# Patient Record
Sex: Female | Born: 1935
Health system: Southern US, Community
[De-identification: ages and names within clinical notes are randomized; demographics above are authoritative.]

## PROBLEM LIST (undated history)

## (undated) DIAGNOSIS — D649 Anemia, unspecified: Secondary | ICD-10-CM

## (undated) HISTORY — PX: ABDOMINAL SURGERY: SHX537

## (undated) HISTORY — PX: OTHER SURGICAL HISTORY: SHX169

---

## 2000-02-23 ENCOUNTER — Encounter: Payer: Self-pay | Admitting: Pulmonary Disease

## 2000-02-23 ENCOUNTER — Inpatient Hospital Stay (HOSPITAL_COMMUNITY): Admission: AD | Admit: 2000-02-23 | Discharge: 2000-03-13 | Payer: Self-pay | Admitting: Pulmonary Disease

## 2000-02-24 ENCOUNTER — Encounter: Payer: Self-pay | Admitting: Pulmonary Disease

## 2000-02-25 ENCOUNTER — Encounter: Payer: Self-pay | Admitting: Pulmonary Disease

## 2000-02-26 ENCOUNTER — Encounter: Payer: Self-pay | Admitting: Thoracic Surgery (Cardiothoracic Vascular Surgery)

## 2000-02-27 ENCOUNTER — Encounter: Payer: Self-pay | Admitting: Thoracic Surgery (Cardiothoracic Vascular Surgery)

## 2000-02-28 ENCOUNTER — Encounter: Payer: Self-pay | Admitting: Pulmonary Disease

## 2000-02-29 ENCOUNTER — Encounter: Payer: Self-pay | Admitting: Thoracic Surgery

## 2000-03-01 ENCOUNTER — Encounter: Payer: Self-pay | Admitting: Thoracic Surgery

## 2000-03-01 ENCOUNTER — Encounter: Payer: Self-pay | Admitting: Cardiothoracic Surgery

## 2000-03-02 ENCOUNTER — Encounter: Payer: Self-pay | Admitting: Thoracic Surgery

## 2000-03-03 ENCOUNTER — Encounter: Payer: Self-pay | Admitting: Cardiothoracic Surgery

## 2000-03-04 ENCOUNTER — Encounter: Payer: Self-pay | Admitting: Thoracic Surgery

## 2000-03-05 ENCOUNTER — Encounter: Payer: Self-pay | Admitting: Thoracic Surgery

## 2000-03-06 ENCOUNTER — Encounter: Payer: Self-pay | Admitting: Thoracic Surgery

## 2000-03-07 ENCOUNTER — Encounter: Payer: Self-pay | Admitting: Thoracic Surgery

## 2000-03-08 ENCOUNTER — Encounter: Payer: Self-pay | Admitting: Thoracic Surgery

## 2000-03-11 ENCOUNTER — Encounter: Payer: Self-pay | Admitting: Thoracic Surgery

## 2000-03-17 ENCOUNTER — Emergency Department (HOSPITAL_COMMUNITY): Admission: EM | Admit: 2000-03-17 | Discharge: 2000-03-17 | Payer: Self-pay | Admitting: Emergency Medicine

## 2000-03-17 ENCOUNTER — Encounter: Payer: Self-pay | Admitting: Emergency Medicine

## 2000-03-22 ENCOUNTER — Encounter: Payer: Self-pay | Admitting: Thoracic Surgery

## 2000-03-22 ENCOUNTER — Inpatient Hospital Stay (HOSPITAL_COMMUNITY): Admission: EM | Admit: 2000-03-22 | Discharge: 2000-05-08 | Payer: Self-pay | Admitting: Emergency Medicine

## 2000-03-25 ENCOUNTER — Encounter: Payer: Self-pay | Admitting: General Surgery

## 2000-03-27 ENCOUNTER — Encounter: Payer: Self-pay | Admitting: General Surgery

## 2000-03-27 ENCOUNTER — Encounter: Payer: Self-pay | Admitting: Critical Care Medicine

## 2000-04-02 ENCOUNTER — Encounter: Payer: Self-pay | Admitting: General Surgery

## 2000-04-03 ENCOUNTER — Encounter: Payer: Self-pay | Admitting: General Surgery

## 2000-04-05 ENCOUNTER — Encounter: Payer: Self-pay | Admitting: General Surgery

## 2000-04-18 ENCOUNTER — Encounter: Payer: Self-pay | Admitting: General Surgery

## 2000-04-22 ENCOUNTER — Encounter: Payer: Self-pay | Admitting: General Surgery

## 2000-04-23 ENCOUNTER — Encounter: Payer: Self-pay | Admitting: General Surgery

## 2000-05-02 ENCOUNTER — Encounter: Payer: Self-pay | Admitting: General Surgery

## 2000-05-29 ENCOUNTER — Ambulatory Visit (HOSPITAL_COMMUNITY): Admission: RE | Admit: 2000-05-29 | Discharge: 2000-05-29 | Payer: Self-pay | Admitting: General Surgery

## 2004-06-06 ENCOUNTER — Ambulatory Visit (HOSPITAL_COMMUNITY): Admission: RE | Admit: 2004-06-06 | Discharge: 2004-06-06 | Payer: Self-pay | Admitting: Family Medicine

## 2004-08-14 ENCOUNTER — Ambulatory Visit: Payer: Self-pay | Admitting: *Deleted

## 2006-04-13 ENCOUNTER — Emergency Department (HOSPITAL_COMMUNITY): Admission: EM | Admit: 2006-04-13 | Discharge: 2006-04-13 | Payer: Self-pay | Admitting: Emergency Medicine

## 2006-06-10 ENCOUNTER — Ambulatory Visit: Payer: Self-pay | Admitting: Ophthalmology

## 2006-08-14 ENCOUNTER — Ambulatory Visit: Payer: Self-pay | Admitting: Ophthalmology

## 2010-03-02 ENCOUNTER — Inpatient Hospital Stay (HOSPITAL_COMMUNITY): Admission: EM | Admit: 2010-03-02 | Discharge: 2010-03-04 | Payer: Self-pay | Source: Home / Self Care

## 2010-03-03 ENCOUNTER — Encounter (INDEPENDENT_AMBULATORY_CARE_PROVIDER_SITE_OTHER): Payer: Self-pay | Admitting: Internal Medicine

## 2010-03-26 ENCOUNTER — Encounter: Payer: Self-pay | Admitting: Family Medicine

## 2010-05-03 NOTE — Discharge Summary (Signed)
NAME:  Ashley Goodwin, Ashley Goodwin             ACCOUNT NO.:  [id number]  MEDICAL RECORD NO.:  [id number]          PATIENT TYPE:  INP  LOCATION:  A329                          FACILITY:  [facility]  PHYSICIAN:  Elliot Meldrum L. Lendell Caprice, MDDATE OF BIRTH:  11/04/1921  DATE OF ADMISSION:  03/02/2010 DATE OF DISCHARGE:  12/31/2011LH                              DISCHARGE SUMMARY   DISCHARGE DIAGNOSES: 1. Near syncope. 2. Acute renal failure secondary to prerenal azotemia. 3. Dehydration. 4. Anemia. 5. Mild rhabdomyolysis. 6. Hypertension, new diagnosis.  DISCHARGE MEDICATIONS: 1. Amlodipine 5 mg a day. 2. Iron tablet, continue as previous. 3. Vitamin B6, continue as previous.  CONDITION:  Stable.  DIET:  Should be low-fat, low-salt with plenty of liquids.  ACTIVITY:  Increase slowly.  FOLLOWUP:  With primary care physician in 2-4 weeks.  CONSULTATIONS:  None.  PROCEDURES:  None.  LABORATORY DATA:  CBC significant for hemoglobin of 10.4, hematocrit of 32.2, MCV is 67.  Basic metabolic panel on admission significant for a BUN of 35, creatinine 2.2.  At discharge, BUN is 15, creatinine 1.09. Liver function tests significant for an albumin of 3.1, total CPK was 609, MB fraction 9.2.  Normal troponin.  At discharge, total CPK is 428, LDL is 161, HDL 31, triglycerides 163, total cholesterol 225, TSH 1.266, ferritin 432.  TIBC 213, iron 21, B12 and folate normal.  Urinalysis showed small leukocyte esterase, negative nitrates, but many epithelial cells.  Serum myoglobin greater than 500.  DIAGNOSTICS:  Chest x-ray showed scarring or atelectasis left lung base, nothing acute.  Carotid Doppler showed atherosclerotic plaque less than 50% of the carotid arteries without critical stenosis, elevated peak systolic velocity in the distal left internal carotid artery probably related to tortuosity, antegrade vertebral flow.  EKG showed normal sinus rhythm.  Echocardiogram showed mild-to-moderate LVH,  ejection fraction 60-65%, no regional wall motion abnormalities, mildly dilated left atrium.  HISTORY AND HOSPITAL COURSE:  Please see H and P for details.  Ashley Goodwin is an 77 year old white female who has not visited a physician for many years.  She had gradual weakness and dizziness over the past few weeks.  She was found to have a blood pressure of 153/83 in the emergency room, but otherwise normal exam.  She had an unremarkable physical examination, had evidence of renal failure.  On admission, she was felt to be dehydrated and had mild rhabdomyolysis.  Initially, she was treated for urinary tract infection, but the urinalysis has many epithelial cells and she clinically has no evidence of urinary tract infection, so antibiotics were stopped.  She was given IV fluids and her renal failure resolved.  Her blood pressure remained elevated and she most likely has been hypertensive for quite some time.  She was started on Norvasc and will be discharged home.  She will follow up with primary care physician of choice.  I have instructed her and her family the importance of close followup and they voiced understanding.  She has been found to have an elevated LDL.  At this point, I have instructed her to follow a low-cholesterol diet for now.  Given her mild rhabdomyolysis and  questionable follow-up plans, it would not be prudent to start her on a statin at this point.  She is feeling much better. She is ambulating.  She is stronger and is stable for discharge.     Bethel Sirois L. Lendell Caprice, MD     CLS/MEDQ  D:  05/02/2010  T:  05/03/2010  Job:  [id number]  Electronically Signed by Crista Curb MD on 05/03/2010 03:58:19 PM

## 2010-05-15 LAB — URINALYSIS, ROUTINE W REFLEX MICROSCOPIC
Glucose, UA: NEGATIVE mg/dL
Nitrite: NEGATIVE
Specific Gravity, Urine: >1.03 — ABNORMAL HIGH (ref 1.005–1.030)
pH: 5.5 (ref 5.0–8.0)

## 2010-05-15 LAB — DIFFERENTIAL
Basophils Absolute: 0 10*3/uL (ref 0.0–0.1)
Basophils Relative: 0 % (ref 0–1)
Basophils Relative: 0 % (ref 0–1)
Eosinophils Absolute: 0.2 10*3/uL (ref 0.0–0.7)
Eosinophils Absolute: 0.2 10*3/uL (ref 0.0–0.7)
Eosinophils Relative: 2 % (ref 0–5)
Eosinophils Relative: 3 % (ref 0–5)
Lymphocytes Relative: 29 % (ref 12–46)
Lymphocytes Relative: 30 % (ref 12–46)
Lymphs Abs: 2 10*3/uL (ref 0.7–4.0)
Lymphs Abs: 2.2 10*3/uL (ref 0.7–4.0)
Monocytes Absolute: 0.8 10*3/uL (ref 0.1–1.0)
Monocytes Relative: 11 % (ref 3–12)
Monocytes Relative: 12 % (ref 3–12)
Neutro Abs: 3.8 10*3/uL (ref 1.7–7.7)
Neutro Abs: 4.3 10*3/uL (ref 1.7–7.7)
Neutro Abs: 5.6 10*3/uL (ref 1.7–7.7)
Neutrophils Relative %: 56 % (ref 43–77)
Neutrophils Relative %: 57 % (ref 43–77)

## 2010-05-15 LAB — URINE MICROSCOPIC-ADD ON

## 2010-05-15 LAB — CBC
HCT: 29.4 % — ABNORMAL LOW (ref 36.0–46.0)
HCT: 30 % — ABNORMAL LOW (ref 36.0–46.0)
HCT: 32.2 % — ABNORMAL LOW (ref 36.0–46.0)
Hemoglobin: 10.4 g/dL — ABNORMAL LOW (ref 12.0–15.0)
Hemoglobin: 9.5 g/dL — ABNORMAL LOW (ref 12.0–15.0)
MCH: 21.9 pg — ABNORMAL LOW (ref 26.0–34.0)
MCH: 21.9 pg — ABNORMAL LOW (ref 26.0–34.0)
MCHC: 32.3 g/dL (ref 30.0–36.0)
MCHC: 32.7 g/dL (ref 30.0–36.0)
MCV: 67.7 fL — ABNORMAL LOW (ref 78.0–100.0)
Platelets: 230 10*3/uL (ref 150–400)
RBC: 4.34 MIL/uL (ref 3.87–5.11)
RBC: 4.74 MIL/uL (ref 3.87–5.11)
RDW: 14.7 % (ref 11.5–15.5)
RDW: 14.8 % (ref 11.5–15.5)
WBC: 6.8 10*3/uL (ref 4.0–10.5)
WBC: 9.8 10*3/uL (ref 4.0–10.5)

## 2010-05-15 LAB — COMPREHENSIVE METABOLIC PANEL
ALT: 14 U/L (ref 0–35)
ALT: 16 U/L (ref 0–35)
AST: 21 U/L (ref 0–37)
Albumin: 2.8 g/dL — ABNORMAL LOW (ref 3.5–5.2)
Alkaline Phosphatase: 73 U/L (ref 39–117)
BUN: 15 mg/dL (ref 6–23)
CO2: 23 mEq/L (ref 19–32)
Calcium: 8.1 mg/dL — ABNORMAL LOW (ref 8.4–10.5)
Calcium: 8.8 mg/dL (ref 8.4–10.5)
Chloride: 111 mEq/L (ref 96–112)
Creatinine, Ser: 1.09 mg/dL (ref 0.4–1.2)
GFR calc Af Amer: 36 mL/min — ABNORMAL LOW (ref >60)
GFR calc Af Amer: 58 mL/min — ABNORMAL LOW (ref >60)
GFR calc non Af Amer: 48 mL/min — ABNORMAL LOW (ref >60)
Glucose, Bld: 112 mg/dL — ABNORMAL HIGH (ref 70–99)
Glucose, Bld: 80 mg/dL (ref 70–99)
Potassium: 3.8 mEq/L (ref 3.5–5.1)
Sodium: 141 mEq/L (ref 135–145)
Sodium: 141 mEq/L (ref 135–145)
Total Bilirubin: 0.1 mg/dL — ABNORMAL LOW (ref 0.3–1.2)
Total Protein: 6.3 g/dL (ref 6.0–8.3)
Total Protein: 6.7 g/dL (ref 6.0–8.3)

## 2010-05-15 LAB — LIPID PANEL
HDL: 31 mg/dL — ABNORMAL LOW (ref >39)
Total CHOL/HDL Ratio: 7.3 RATIO
Triglycerides: 163 mg/dL — ABNORMAL HIGH (ref <150)
VLDL: 33 mg/dL (ref 0–40)

## 2010-05-15 LAB — POCT CARDIAC MARKERS
CKMB, poc: 10.5 ng/mL (ref 1.0–8.0)
Myoglobin, poc: >500 ng/mL (ref 12–200)
Troponin i, poc: <0.05 ng/mL (ref 0.00–0.09)

## 2010-05-15 LAB — FOLATE: Folate: 19.8 ng/mL

## 2010-05-15 LAB — IRON AND TIBC
Saturation Ratios: 10 % — ABNORMAL LOW (ref 20–55)
TIBC: 213 ug/dL — ABNORMAL LOW (ref 250–470)
UIBC: 192 ug/dL

## 2010-05-15 LAB — CARDIAC PANEL(CRET KIN+CKTOT+MB+TROPI)
Total CK: 606 U/L — ABNORMAL HIGH (ref 7–177)
Troponin I: 0.02 ng/mL (ref 0.00–0.06)

## 2010-05-15 LAB — BASIC METABOLIC PANEL
CO2: 23 mEq/L (ref 19–32)
Chloride: 104 mEq/L (ref 96–112)
GFR calc Af Amer: 25 mL/min — ABNORMAL LOW (ref >60)
Sodium: 138 mEq/L (ref 135–145)

## 2010-05-15 LAB — CK TOTAL AND CKMB (NOT AT ARMC)
CK, MB: 5.5 ng/mL — ABNORMAL HIGH (ref 0.3–4.0)
Relative Index: 1.3 (ref 0.0–2.5)
Total CK: 428 U/L — ABNORMAL HIGH (ref 7–177)

## 2010-05-15 LAB — VITAMIN B12: Vitamin B-12: 487 pg/mL (ref 211–911)

## 2010-05-15 LAB — FERRITIN: Ferritin: 432 ng/mL — ABNORMAL HIGH (ref 10–291)

## 2010-07-21 NOTE — Op Note (Signed)
[place]. [facility]  Patient:    Ashley Goodwin, Ashley Goodwin                      MRN: [id number] Proc. Date: 04/17/00 Adm. Date:  [id number] Attending:  Caleen Essex                           Operative Report  PREOPERATIVE DIAGNOSIS:  Gastric outlet obstruction.  POSTOPERATIVE DIAGNOSIS:  Gastric outlet obstruction.  PROCEDURE:  Takedown of gastrostomy and jejunostomy tube and antecolic gastrojejunostomy.  SURGEON:  Ollen Gross. Carolynne Edouard, M.D.  ASSISTANT:  Catalina Lunger, M.D.  ANESTHESIA:  General endotracheal.  PROCEDURE:  After informed consent was obtained, the patient was brought to the operating room and placed in a supine position on the operating room table.  After adequate induction of general anesthesia, the patients abdomen was prepped with Betadine and draped in usual sterile manner.  A midline incision was made through her previous midline scar with a 10-blade knife. This incision was carried down through the skin and subcutaneous tissue with the Bovie electrocautery until the stitches in the linea alba were identified.  This area was also opened carefully with the Bovie electrocautery.  Once through the fascia, the preperitoneal space was probed bluntly and gently until the abdominal cavity was found.  Once the peritoneal cavity was opened, a finger was inserted to palpate the anterior abdominal wall and about the mid portion of the incision there was some adherence of bowel to the abdominal wall.  This was taken down by a combination of blunt finger dissection, as well as sharp dissection with the Metzenbaum scissors until the entire abdominal incision could be opened freely.  Once this was accomplished, there were adhesions encountered to the anterior abdominal wall on both sides and again, these were taken down sharply with Metzenbaum scissors.  Once this was accomplished, the area where the gastrostomy and jejunostomies were tacked to the  anterior abdominal wall were located and these were taken down sharply with the Metzenbaum scissors.  Once this was accomplished, the tubes were cut on the outside and brought through the abdominal wall.  Both tubes were then removed from the stomach and jejunum respectively and the enterotomies at each site were closed with multiple interrupted 3-0 silk suture Lembert stitches. The bowel was examined and the repairs appeared to seal these holes very well. The small bowel was then run in its entirety and was found to be normal.  The omentum was densely adherent in the left upper quadrant and only a small portion of this could be freed from its adhesions.  The anterior wall of the stomach was readily identifiable and opened.  A portion of the small bowel distal to the previous jejunostomy site was chosen that was freely mobile and reached up to the anterior wall of the stomach very easily.  The bowel was then the antimesenteric wall of the bowel was then approximated to the anterior wall of the stomach with proximally and distally with a 3-0 silk stitch.  An enterotomy was made in the anterior wall of the stomach and the antimesenteric border of the bowel and a GIA-75 stapler was fired along this line.  The anterior of the anastomosis was inspected and was found to be widely patent and hemostatic.  The stomach and jejunal opening/defect at the edge of the anastomosis was then closed with multiple interrupted 3-0 silk stitches until the  enterotomy was well approximated and closed.  The NG tube was then palpated and anesthesia was able to position it just above the anastomosis and anchored this in place.  The abdomen was then irrigated with copious amounts of saline.  The abdomen was then closed with two running #1 PDS sutures.  The fascia of the anterior abdominal wall was then closed with two running #1 PDS sutures and the skin was closed with staples.  The patient tolerated the procedure  well.  At the end of the case, all needle, sponge and instrument counts were correct.  The patient was awakened and taken to the recovery room in stable condition. DD:  04/18/00 TD:  04/18/00 Job: [id number]

## 2010-07-21 NOTE — Op Note (Signed)
[place]. [facility]  Patient:    Ashley Goodwin, CAPERS                      MRN: [id number] Proc. Date: 02/23/00 Adm. Date:  [id number] Attending:  Gailen Shelter CC:         Danice Goltz, M.D.   Operative Report  PREOPERATIVE DIAGNOSIS:  Esophageal rupture.  POSTOPERATIVE DIAGNOSIS:  Esophageal rupture.  PROCEDURES:  Left thoracotomy, primary repair and drainage of esophageal perforation, with intracostal muscle flap coverage.  SURGEON:  Salvatore Decent. Dorris Fetch, M.D.  ASSISTANTS:  Norton Blizzard, M.D., and Sherrie George, P.A.  ANESTHESIA:  General.  FINDINGS:  Chronic adhesions, left chest.  Multiple pockets of murky pleural fluid.  Frank pus contained in the area around the esophagus and aorta.  A 3 cm linear tear in the distal esophagus just above the gastroesophageal junction.  CLINICAL NOTE:  Ms. Ashley Goodwin is a 77 year old African-American female who presented to [facility] on December 19 with acute abdominal pain. She had free air in her abdomen.  She underwent exploration and repair of a perforated duodenal ulcer with a Cheree Ditto patch.  Subsequently postoperatively she developed increasing tachycardia and hypotension and a left pleural effusion.  She was transferred to [facility] for further evaluation.  She was seen and evaluated by Dr. Corliss Skains, who very astutely suspected esophageal rupture and Boerhaave-type syndrome.  The patient was sent for a swallow, which showed a distal esophageal perforation.  The patient was informed of the need for emergent surgery, possible repair versus resection and diversion, and because of her recent gastrointestinal surgery, Dr. Carolynne Edouard of general surgery was consulted as well.  The patient understood the risks and benefits and agreed to proceed with surgery.  She was taken to the operating room as soon as feasible.  DESCRIPTION OF PROCEDURE:  Ms. Galeno was brought to the operating room  from the medical intensive care unit.  Central venous access was obtained. Intraoperative arterial blood pressure monitoring catheter was placed.  The patient was anesthetized and intubated with a double-lumen endotracheal tube. The patient was placed in the right lateral decubitus position, and the left chest was prepped and draped in the usual fashion.  Single-lung ventilation was carried out at the right lung.  A left thoracotomy was performed.  The chest was entered through the sixth interspace.  The latissimus muscle was divided, and the serratus muscle was spared in the thoracotomy.  The pleural space was opened, and it was immediately obvious that there were chronic adhesions in the pleural space.  These were taken down with sharp and blunt dissection and electrocautery.  There were multiple pockets of murky fluid, which were sent for cultures, but no frank pus in the pleural space.  The adhesions were extensive.  The lung itself appeared relatively normal for the patients age.  After freeing the lung from the parietal pleura circumferentially, the posterior mediastinum was exposed by dividing the inferior pulmonary ligament.  At this point, gross pus was present.  This was cultured and evacuated.  There was air present in the mediastinal fat, but the mediastinal fat itself was not necrotic.  The esophagus was identified and inspected.  There was an approximately 3 cm long linear tear in the esophagus just above the gastroesophageal junction on the left side.  An NG tube was passed under direct vision.  The tube was advanced beyond the point of the perforation, securely taped at the nose.  The perforation in the esophagus then was closed in two layers.  The first layer of the closure was interrupted 3-0 Vicryl sutures to close the mucosa.  The serosa and overlying pleural peel then were closed then over the mucosal repair with interrupted 3-0 PDS sutures.  The surrounding pleura was  inadequate to use as a flap to cover the repair; therefore, an intercostal muscle flap was developed.  The seventh rib was reflected in a subperiosteal fashion.  The intercostal muscle and neurovascular bundle were divided posteriorly, and the muscle flap was based off the anterior circulation.  There was good flow through the intercostal vessels.  These were clipped.  The intercostal muscle flap then was brought down directly onto the repair and sutured circumferentially around the area of the repair with interrupted 3-0 Vicryl sutures.  The chest was copiously irrigated with warm normal saline.  Hemostasis was achieved.  Two 36 French straight chest tubes were placed anteriorly and posteriorly in the pleural space, and a 36 French right angle tube was placed directly adjacent to the esophageal repair.  The lung was then reinflated.  The ribs were reapproximated with #2 Vicryl paracostal sutures.  The [facility] rib approximator was used to bring the ribs together while the sutures were tied.  The serratus muscle was reattached inferolaterally with running #1 Vicryl suture, the latissimus fascia was closed with running #1 Vicryl suture, subcutaneous tissue was closed with a running 2-0 Vicryl suture, and the skin was closed with staples.  Chest tubes were placed to suction.  The patient remained in the operating room.  She was placed back into the supine position.  The abdomen was prepped and draped in the usual fashion, and then gastrostomy and jejunostomy tubes were performed by Drs. Jerrye Noble of the general surgery service.  After that, the patient was taken from the operating room to the surgical intensive care unit intubated and in critical condition.  All sponge, needle, and instrument counts were correct at the end of the procedure. DD:  02/25/00 TD:  02/26/00 Job: [id number]

## 2010-07-21 NOTE — Discharge Summary (Signed)
[place]. [facility]  Patient:    Ashley Goodwin, Ashley Goodwin                      MRN: [id number] Adm. Date:  [id number] Disc. Date: 03/13/00 Attending:  Cameron Proud Dictator:   Loura Pardon, P.A. CC:         Chevis Pretty, M.D.  [address] [place], [place] [address]   Discharge Summary  FINAL DIAGNOSES:  1. On this admission:  Hypotension, large left pleural effusion, 3 cm linear     tear at the left posterolateral esophagus secondary to emetic rupture.  2. Exploratory laparotomy with repair of large perforated duodenal ulcer with     omental Cheree Ditto) patch on the day before her current admission.  This was     done at [facility].  3. Gastric outlet obstruction demonstrated on second Gastrografin swallow,     December 31 (postoperative day #10).  4. Gross pus in posterior mediastinum discovered during operative repair of     esophageal perforation, December 21.  Place on imipenem postoperatively     for a 10-day course, then Tequin for postoperative day #10 to     postoperative #17.  5. Acute blood loss anemia postoperatively with transfusion December 23 and     March 05, 2000.  6. Mild toxic metabolic encephalopathy postoperatively resolved by     postoperative day #6.  7. Deconditioned post surgery requiring long-term physical therapy.  8. Elevated serum blood glucose in the postoperative period.  SECONDARY DIAGNOSES:  1. History of peptic ulcer disease.  2. Finding of H. pylori on admission to [facility] for perforated     duodenal ulcer/peritonitis.  3. History of tobacco habituation with a history of 42-pack-year history of     smoking.  4. Chronic obstructive pulmonary disease.  5. Status post exploratory laparotomy with repair of perforated duodenal     ulcer, December 20, with Graham patch (omental patch).  6. Hiatal hernia.  PROCEDURES:  1. February 23, 2000 - Left thoracotomy primary repair and drainage of    esophageal perforation with intracostal muscle flap coverage, Dr. Charlett Lango.  2. February 23, 2000 - Exploratory laparotomy and placement of gastrostomy     tube; placement of jejunostomy tube, Dr. Chevis Pretty.  DISCHARGE DISPOSITION:  Ms. Kingsley Herandez is judged a suitable candidate for discharge on postoperative day #19.  She has been largely afebrile in the postoperative period.  She was maintained on a ventilator after surgery until postoperative day #4 when she was extubated successfully.  She was achieving 96-100% oxygen saturation on room air by postoperative day #5.  She underwent esophagogram on postoperative day #7 which showed no extravasation of contrast in the esophagus.  A second Gastrografin swallow was performed on postoperative day #10.  There was no esophageal leak.  However, the Gastrografin was unable to flow from the stomach into the duodenum with possible finding of gastric outlet obstruction.  She remains, therefore, NPO except for ice chips.  Her gastrostomy tube was still in place with drainage of clear fluid.  She is receiving tube feeds of Jevity Plus at a rate of 70 cc an hour.  She is tolerating these tube feedings well.  Her wounds are well healed and all staples have been removed.  Her pain is controlled with analgesia through the J tube.  A transient toxic metabolic encephalopathy has resolved as of postoperative day #  6.  She was maintained on nebulizer therapy of albuterol and Atrovent in the postoperative period, but these nebulizers have no longer been necessary.  Home Health has been arranged for restorative care and to monitor tube feedings and to continue with instructions to the family.  She has been cleared for discharge by both general surgery and thoracic surgery.  She will undergo an endoscopy consisting of esophagogastro (hopefully) duodenoscopy for her gastric outlet obstruction syndrome as an outpatient.  DISCHARGE  MEDICATIONS:  1. Roxicet (Percocet elixir) 5-10 cc per J tube every 4-6h. p.r.n. pain.  2. Zantac EFFERdose 100 mg mixed with 6-8 ounces of water and given through     the J tube twice daily.  The J tube should be flushed by syringe with     30 cc of sterile saline or water every 6 hours.  DISCHARGE ACTIVITY:  Ambulate as tolerated.  DIET:  Jevity Plus at 70 cc/h.  Orally, she shall take ice chips only, otherwise she is NPO.  The gastrostomy tube will be drained as needed.  INSTRUCTIONS:  She may bathe daily, keeping the areas around her G tube and J tube clean and dry.  She is either to sponge bathe or shower only.  She is to wear protective hosiery consisting of TED hose daily.  FOLLOWUP:  She will have follow-up visit with Dr. Dorris Fetch on Wednesday, January 23, at 9:45 a.m.  She will present to the [facility] one hour before the appointment with Dr. Dorris Fetch for a chest x-ray.  She is also to arrange an appointment with Dr. Chevis Pretty two weeks from discharge. His telephone number is [phone].  She is to call and arrange an appointment  BRIEF HISTORY:  Ashley Goodwin is a 77 year old female who presented to [facility] [place], with abdominal pain and subsequently underwent exploratory laparotomy for perforated duodenal ulcer/peritonitis. The day after surgery she developed hypotension with pneumomediastinum.  She was transferred to  [facility] to the care of the pulmonary medicine critical care team.  In addition, she was consulted by the [facility]  In addition, Ms. Gorczyca had a large left pleural effusion.  She underwent Gastrografin swallow which showed perforation of the left posterolateral esophagus just about the level of the gastroesophageal junction.  She was taken urgently to the operating room for repair and drainage of perforated esophagus, with placement of intracostal flap  by Dr. Charlett Lango.   HOSPITAL COURSE:  After undergoing surgery on December 21 consisting of left thoracotomy, primary repair and drainage of esophageal perforation by Dr. Charlett Lango, and an additional placement of G tube and jejunostomy tube by Dr. Chevis Pretty, the patient was transferred in stable and satisfactory condition to the intensive care unit.  She was maintained on IV imipenem in the intraoperative and postoperative period for a 10-day course since there was finding of frank pus in the posterior mediastinum.  On postoperative day #2 she was transfused with 1 unit of packed red blood cells for a hemoglobin of 8.  Her hemoglobin was 9.4 on postoperative day #3.  She was started on tube feeds on postoperative day #3 by way of the J tube.  The tube feed was an elemental feed Peptamen VHP.  On postoperative day #4 Ms. Obeirne was extubated.  Chest x-ray showed improving left lower lobe atelectasis.  Body fluid cultures which had been taken intraoperatively showed multiple organisms.  No Staphylococcus aureus. There was no growth in  three days.  Blood cultures taken on December 21 were also negative.  On postoperative day #5 she was achieving 96-100% oxygen saturation on room air and she did not require oxygen supplementation throughout her ensuing hospital course.  On postoperative day #6 she began physical therapy with the finding that she would require long-term therapy to regain preoperative conditioning.  On postoperative day #7 she underwent esophagogram which showed no extravasation of contrast from the esophagus.  On postoperative day #8 the wound showed evidence of healing well.  On postoperative day #10 she underwent a repeat Gastrografin swallow.  The study also showed no esophageal leak.  However, the Gastrografin was unable to flow from the stomach into the duodenum with the finding of possible gastric outlet obstruction syndrome.  A CT of the  abdomen also on postoperative day #10 was negative for finding of abdominal abscess.  She was started on Tequin on postoperative day #10 and continued on that until postoperative day #17.  She continued to do well with both G tube and J tube in place.  On postoperative #17, Peptamen tube feeds were changed to Jevity Plus which she tolerated well at a goal of 70 cc/h.  By postoperative day #18 both general surgery and thoracic surgery agreed that she would be a suitable candidate for discharge.  She remained NPO with ice chips only and would at some time in the future have esophagogastroduodenoscopy as an outpatient. DD:  03/12/00 TD:  03/12/00 Job: [id number]

## 2010-07-21 NOTE — Consult Note (Signed)
[place]. [facility]  Patient:    Ashley Goodwin, Ashley Goodwin                      MRN: [id number] Adm. Date:  [id number] Attending:  Caleen Essex CC:         Chevis Pretty, M.D.  Salvatore Decent Dorris Fetch, M.D.   Consultation Report  I was asked by Dr. Carolynne Edouard to see this 77 year old female who was recently discharged from [facility] on January 9 after having been treated for a perforated duodenal ulcer and esophageal tear with mediastinitis.  She underwent thoracotomy and repair of the esophageal perforation on December 21 and exploratory laparotomy, NG and J tube placement.  She had previously had duodenal ulcer repaired at [facility] several weeks earlier.  G tube drainage since discharge has been continuously anywhere from 1-2 L per 24 hours and she has been receiving Jevity Plus at 70 cc/hour per J tube.  She presents now with weakness and metabolic derangement.  PAST MEDICAL HISTORY:  Peptic ulcer disease, H. pylori positive, cigarette abuse ______ pack years, COPD.  MEDICATIONS:  Percocet, Zantac, Jevity.  SOCIAL HISTORY:  Currently lives with her daughters.  PHYSICAL EXAMINATION:  VITAL SIGNS:  Blood pressure 109/73, heart rate 109.  GENERAL:  She is a thin appearing, dry appearing African-American female.  HEENT:  Dry tongue.  Facial features are sunken.  NECK:  Veins are flat.  LUNGS:  Clear.  HEART:  Regular rate and rhythm.  ABDOMEN:  J tube, G tube, and postoperative wound is well healed with Steri-Strips still in place.  EXTREMITIES:  No edema of the lower extremities.  There is decreased skin turgor.  LABORATORIES:  Sodium 157, potassium 2.8, chloride 68, CO2 62, BUN 209, creatinine 3.4, total protein 9.3, calcium 10.1, albumin 3.6, SGOT 134, SGPT 265, alkaline phosphatase 247.  White blood count 24,900.  ASSESSMENT:  1. Acute renal failure with prerenal azotemia.  2. Hyponatremia.  3. Hypokalemia.  4. Hypochloremic  metabolic alkalosis.  5. Dehydration.  6. Numbers 1-5 are secondary to gastrointestinal losses.  7. Abnormal liver function studies.  8. Status post esophageal repair.  9. Status post recent duodenal ulcer repair. 10. Status post mediastinitis. 11. Leukocytosis.  PLAN: 1. Replace free water, potassium, and chloride. 2. Family education regarding G tube. 3. Treat for possible infection. DD:  03/22/00 TD:  03/23/00 Job: [id number]

## 2010-07-21 NOTE — H&P (Signed)
[place]. [facility]  Patient:    Ashley Goodwin, BOSSMAN                      MRN: [id number] Adm. Date:  [id number] Attending:  Doug Sou                         History and Physical  HISTORY OF PRESENT ILLNESS:  Ms. Cardin is a 77 year old white female who is well known to the surgery service, who was initially transferred to this hospital on February 23, 2000, with the diagnosis of a perforated esophagus. Two days prior to this she had been operated on at [facility] for a perforated duodenal ulcer.  She at the time of the transfer was hypotensive and tachycardic.  She was volume resuscitated and taken urgently to the operating room after arrival for a left thoracotomy, through which her esophagus was repaired.  At that point, her abdomen was then re-explored, and her Cheree Ditto patch repair of her duodenal ulcer appeared to be intact. During this part of the procedure a G-tube and feeding jejunostomy tube were placed. Her postoperative course was significant for a prolonged intensive care unit stay.  She was able to come off of the ventilator after several days.  She then underwent a swallowing study, which showed no leak from her esophagus. Her gastrostomy tube was clamped.  Within 24 hours she had several episodes of vomiting, and a repeat study showed that she had a gastric outlet obstruction. At the time it was not clear whether this was secondary to edema, scarring from chronic peptic ulcer disease, or a result of the vagotomy.  Given that she had recently had her esophagus repaired, it was thought somewhat risky to expose her to an upper endoscopy at that point, so her gastrostomy was kept drained, and she was continued to be fed through her feeding jejunostomy tube. She tolerated this very well, and her condition improved to the point of being able to be discharged home.  Her discharge took place on March 13, 2000.  She was apparently doing  well at home until the last few days, at which time her feeding jejunostomy tube was clogged.  She was brought to the hospital, and by report her old feeding jejunostomy tube was removed, and a new one placed through the tract that was created.  This apparently happened several days ago, and over the last couple of days the patient has been complaining of abdominal pain and back pain, and the feeling of needing to go to the bathroom.  She has been a little more confused than prior days, and feels overall very weak.  She is now brought back to the hospital for further evaluation of these new pains, weakness, and confusion.  PAST MEDICAL HISTORY:  Significant for chronic peptic ulcer disease.  PAST SURGICAL HISTORY: 1. Significant for a repair of her duodenal ulcer 2. Repair of a perforated esophagus. 3. Placement of a feeding jejunostomy tube and gastrostomy tube.  SOCIAL HISTORY:  She smokes about one pack of cigarettes a day.  Denies any alcohol use.  FAMILY HISTORY:  Unremarkable.  CURRENT MEDICATIONS:  None except for her tube feeds.  ALLERGIES:  No known drug allergies.  PHYSICAL EXAMINATION:  GENERAL:  She is an elderly black female, lying in bed, who appears to be fairly weak and ill-appearing.  SKIN:  Cool and dry.  HEENT:  Extraocular muscles intact.  Pupils equal,  round, reactive to light.  NECK:  No bruits.  LUNGS:  Clear to auscultation bilaterally.  HEART:  A regular rate and rhythm.  ABDOMEN:  Soft, with no peritoneal signs, but some tenderness over her lower abdomen to palpation.  Her G-tube and J-tube sites are clean and intact.  EXTREMITIES:  No cyanosis, clubbing, or edema.  NEUROLOGIC:  She is oriented to person, place, and year, but not to day.  HEMATOLOGIC:  I can palpate no lymphadenopathy.  Plain x-rays of her abdomen were unremarkable.  There was no evidence for free air.  A CBC has been obtained, which is significant for a white count of  24,900, hemoglobin 14.2, hematocrit 44.6, and a platelet count of 439,000.  ASSESSMENT/PLAN:  This is a 77 year old black female, who underwent an esophageal and duodenal perforation repairs about one month ago, who presents now with new onset of abdominal pain, and weakness, and confusion.  PLAN:  We will obtain a CT scan of her abdomen and pelvis, to rule out an abscess or perforation.  We will admit her to the surgical service for further evaluation and management. DD:  03/22/00 TD:  03/22/00 Job: [id number]

## 2010-07-21 NOTE — Discharge Summary (Signed)
[place]. [facility]  Patient:    Ashley Goodwin, Ashley Goodwin                      MRN: [id number] Adm. Date:  [id number] Disc. Date: [id number] Attending:  Caleen Essex                           Discharge Summary  HISTORY:  Briefly, Ms. Blixt is a 77 year old woman who suffered a perforation of both her duodenum and her esophagus.  She underwent surgery several months ago for this and postoperatively was found to have gastric outlet obstruction.  She was maintained on tube feeds for approximately eight weeks, at which time she was then brought back to the hospital and taken to the operating room for a gastric jejunostomy.  The vagotomy portion of her ulcer surgery was done at the time of her esophageal repair.  Her postoperative course has been complicated by delayed gastric emptying. Several days ago, she was able to have her nasogastric tube removed.  During the few days following that, she vomited approximately once a day with decreasing volumes at each event and over the last 4-5 days, she has had no vomiting at all.  She has been able to tolerate a postgastrectomy diet, and today she is ready for discharge home.  DISCHARGE MEDICATIONS: 1. Pepcid 20 mg p.o. q.d. 2. Reglan 10 mg p.o. q.i.d. 3. Darvocet p.r.n.  ACTIVITY LEVEL:  She is to do no heavy lifting.  DIET:  Postgastrectomy diet.  CONDITION:  Stable.  FINAL DIAGNOSIS:  Gastric outlet obstruction.  FOLLOW-UP:  She is to follow up with me in the clinic in two weeks, and she has our office number if she has any problems between now and then.  Otherwise she is ready for discharge to home. DD:  05/08/00 TD:  05/08/00 Job: [id number]

## 2010-07-21 NOTE — Op Note (Signed)
[place]. [facility]  Patient:    Ashley Goodwin, Ashley Goodwin                      MRN: [id number] Proc. Date: 02/23/00 Adm. Date:  [id number] Attending:  Gailen Shelter                           Operative Report  PREOPERATIVE DIAGNOSES: 1. Perforated esophagus. 2. Recently perforated duodenal ulcer.  POSTOPERATIVE DIAGNOSES: 1. Perforated esophagus. 2. Recently perforated duodenal ulcer.  PROCEDURES:  Exploratory laparotomy and placement of gastrostomy tube, placement of feeding jejunostomy tube.  SURGEON:  Chevis Pretty, M.D.  ASSISTANT:  Abigail Miyamoto, M.D.  ANESTHESIA:  General endotracheal.  DESCRIPTION OF PROCEDURE:  After informed consent was obtained, the patient was brought to the operating room and placed in the supine position on the operating table.  After adequate induction of general anesthesia, the patients abdomen was prepped with Betadine and draped in the usual sterile manner.  Her previously-placed staples were removed with hemostats, and the sutures closing her midline abdominal fascia were cut with scissors and removed.  Her abdomen was opened, and there did not appear to be any gross spillage of duodenal contents within the abdomen.  There was a small amount of serosanguineous ascites fluid.  On examination of her duodenum, she had an obvious Graham patch repair that appeared to be intact with drains surrounding it.  An appropriate place on the anterior surface of the stomach was chosen, and two concentric 2-0 silk pursestring sutures were placed in a circular fashion.  A hole was made in the stomach at that point with the Bovie electrocautery, and a 28 French Malecot tube was placed through this hole in the stomach into its lumen.  Each of the pursestring sutures was then cinched down and tied.  The stomach was then tacked to the anterior abdominal wall at the exit site.  Prior to doing this, a small incision was made in the left upper  quadrant of the abdomen with a 10 blade knife, and a tonsil clamp was placed through this hole through the abdominal wall percutaneously.  The end of the Malecot tube was grasped and pulled through the abdominal wall to the outside.  Then the stomach was tacked to the anterior abdominal wall using multiple 2-0 silk suture stitches.  Next, the Malecot tube was anchored to the skin using a 2-0 nylon suture.  Next, a site was chosen approximately 20 cm downstream from the ligament of Treitz on the small bowel.  Again a pursestring 2-0 silk suture was placed in the antimesenteric wall of the small bowel, and a small hole was made in the middle of the pursestring.  A 14 French red rubber catheter was placed through this hold downstream into the bowel, and the pursestring suture was cinched and tied.  Another hole was made in the skin in the left lower quadrant, left middle area of the abdomen with a 10 blade knife, and a tonsil clamp was placed through this hole percutaneously into the abdominal cavity, and the end of the red rubber catheter was grasped and brought out through this hole.  The red rubber catheter was then tunneled along the small bowel in a Witzel fashion, and the small bowel was then anchored at this point to the anterior abdominal wall using multiple 2-0 silk suture stitches.  The abdomen was then irrigated with saline, and the abdominal  fascia was closed with two running #1 PDSs.  The subcutaneous tissue was irrigated with saline, and the skin was closed with staples.  The patient tolerated the procedure well.  At the end of the case, all needle, sponge, and instrument counts were correct.  The patient was taken intubated back to the ICU in critical condition. DD:  02/24/00 TD:  02/26/00 Job: [id number]

## 2010-07-21 NOTE — Consult Note (Signed)
[place]. [facility]  Patient:    Ashley Goodwin, Ashley Goodwin                      MRN: [id number] Proc. Date: 02/23/00 Adm. Date:  [id number] Attending:  Gailen Shelter                          Consultation Report  HISTORY OF PRESENT ILLNESS:  The patient is a 77 year old white female who presented to a hospital in [place] a couple of days ago with abdominal pain.  She underwent a CT scan at that point that, by report, was significant for free intra-abdominal air near the duodenum, thought to be a perforated duodenal ulcer.  She was taken to the operating room there and explored, and a perforation of her duodenum was repaired with a Cheree Ditto patch.  She was subsequently found to have a perforated esophagus and was transferred to [facility] for further management and evaluation.  She is in the process of being prepared to go to the operating room by CVTS to repair her perforated esophagus, and we are being asked to see her for placement of a gastrostomy tube and a feeding jejunostomy tube and to evaluate her prior to the operation at the same time.  PAST MEDICAL HISTORY:  Unremarkable except for the above.  PAST SURGICAL HISTORY:  Unremarkable except for the above.  SOCIAL HISTORY:  She denies any alcohol use.  She smokes a pack of cigarettes a day.  FAMILY HISTORY:  Unremarkable.  MEDICATIONS PRIOR TO THIS:  None.  ALLERGIES:  No known drug allergies.  PHYSICAL EXAMINATION:  ABDOMEN:  Soft and nontender with staples down the midline and two drains coming out her right side.  VITAL SIGNS:  She had a blood pressure of 130 and a heart rate of about 130.  GENERAL:  She was awake, alert, and able to communicate.  LUNGS:  She had bilateral rhonchi, the worse breath sounds on the left.  ASSESSMENT AND PLAN:  This is a 77 year old black female with a perforated esophagus and a recent history of a perforated duodenal ulcer, who is on her way to the  operating room for repair of her esophagus.  We will plan to explore her abdomen after CVTS is done. DD:  02/24/00 TD:  02/24/00 Job: [id number]

## 2010-10-31 ENCOUNTER — Inpatient Hospital Stay: Payer: Self-pay | Admitting: Internal Medicine

## 2010-12-26 ENCOUNTER — Emergency Department (HOSPITAL_COMMUNITY): Payer: Medicare Other

## 2010-12-26 ENCOUNTER — Emergency Department (HOSPITAL_COMMUNITY)
Admission: EM | Admit: 2010-12-26 | Discharge: 2010-12-26 | Disposition: A | Discharge status: Home / Self Care | Payer: Medicare Other | Attending: Emergency Medicine | Admitting: Emergency Medicine

## 2010-12-26 DIAGNOSIS — R109 Unspecified abdominal pain: Secondary | ICD-10-CM | POA: Insufficient documentation

## 2010-12-26 LAB — DIFFERENTIAL
Basophils Absolute: 0 10*3/uL (ref 0.0–0.1)
Basophils Relative: 0 % (ref 0–1)
Eosinophils Absolute: 0.1 10*3/uL (ref 0.0–0.7)
Eosinophils Relative: 1 % (ref 0–5)
Lymphocytes Relative: 30 % (ref 12–46)
Monocytes Absolute: 0.7 10*3/uL (ref 0.1–1.0)

## 2010-12-26 LAB — CBC
HCT: 31.6 % — ABNORMAL LOW (ref 36.0–46.0)
MCHC: 32.3 g/dL (ref 30.0–36.0)
MCV: 74 fL — ABNORMAL LOW (ref 78.0–100.0)
Platelets: 274 10*3/uL (ref 150–400)
RDW: 15.3 % (ref 11.5–15.5)
WBC: 7.2 10*3/uL (ref 4.0–10.5)

## 2010-12-26 LAB — URINALYSIS, ROUTINE W REFLEX MICROSCOPIC
Ketones, ur: NEGATIVE mg/dL
Protein, ur: NEGATIVE mg/dL
Urobilinogen, UA: 0.2 mg/dL (ref 0.0–1.0)

## 2010-12-26 LAB — BASIC METABOLIC PANEL
Calcium: 9.7 mg/dL (ref 8.4–10.5)
Creatinine, Ser: 1.52 mg/dL — ABNORMAL HIGH (ref 0.50–1.10)
GFR calc Af Amer: 34 mL/min — ABNORMAL LOW (ref >90)
GFR calc non Af Amer: 30 mL/min — ABNORMAL LOW (ref >90)
Sodium: 137 mEq/L (ref 135–145)

## 2010-12-26 LAB — URINE MICROSCOPIC-ADD ON

## 2010-12-26 MED ORDER — HYDROCODONE-ACETAMINOPHEN 5-325 MG PO TABS: 1.0000 | ORAL_TABLET | Freq: Four times a day (QID) | ORAL | Status: AC | PRN

## 2010-12-26 NOTE — ED Provider Notes (Signed)
History     CSN: [id number] Arrival date & time: 12/26/2010 11:00 AM   First MD Initiated Contact with Patient 12/26/10 1107      Chief Complaint  Patient presents with  . Flank Pain    (Consider location/radiation/quality/duration/timing/severity/associated sxs/prior treatment) HPI Comments: Pt has been having L posterior flank pain ~ 1 month.  States the sxs are just like when she had a UTI ~ 2-3 months ago.  No h/o kidney stones.  No fever or chills.  No trauma.  Pain worse with movement and palpation.  Patient is a 77 y.o. female presenting with flank pain. The history is provided by the patient. No language interpreter was used.  Flank Pain This is a recurrent problem. The current episode started more than 1 month ago. The problem occurs intermittently. The problem has been gradually worsening. Pertinent negatives include no chills, fever, myalgias, nausea or urinary symptoms. The symptoms are aggravated by bending, walking, twisting and standing.    History reviewed. No pertinent past medical history.  Past Surgical History  Procedure Date  . Abdominal surgery     No family history on file.  History  Substance Use Topics  . Smoking status: Never Smoker   . Smokeless tobacco: Not on file  . Alcohol Use: No    OB History    Grav Para Term Preterm Abortions TAB SAB Ect Mult Living                  Review of Systems  Constitutional: Negative for fever and chills.  Gastrointestinal: Negative for nausea.  Genitourinary: Positive for flank pain. Negative for dysuria, urgency, frequency, hematuria, decreased urine volume and pelvic pain.  Musculoskeletal: Negative for myalgias.  All other systems reviewed and are negative.    Allergies  Review of patient's allergies indicates no known allergies.  Home Medications   Current Outpatient Rx  Name Route Sig Dispense Refill  . ACETAMINOPHEN 500 MG PO TABS Oral Take 500 mg by mouth every 6 (six) hours as needed. For  pain     . VITAMIN B1-B12 IM Intramuscular Inject 1 mL into the muscle every 30 (thirty) days.        BP 139/81  Pulse 77  Temp(Src) 97.7 F (36.5 C) (Oral)  Resp 17  Ht 5\' 7"  (1.702 m)  Wt 145 lb (65.772 kg)  BMI 22.71 kg/m2  SpO2 100%  Physical Exam  Nursing note and vitals reviewed. Constitutional: She is oriented to person, place, and time. She appears well-developed and well-nourished. No distress.  HENT:  Head: Normocephalic and atraumatic.  Eyes: EOM are normal.  Neck: Normal range of motion.  Cardiovascular: Normal rate, regular rhythm and normal heart sounds.   Pulmonary/Chest: Effort normal and breath sounds normal.  Abdominal: Soft. She exhibits no distension. There is no tenderness.  Musculoskeletal: Normal range of motion.       Lumbar back: She exhibits tenderness and pain. She exhibits no bony tenderness, no swelling and no spasm.       Back:  Neurological: She is alert and oriented to person, place, and time.  Skin: Skin is warm and dry. She is not diaphoretic.  Psychiatric: She has a normal mood and affect. Judgment normal.    ED Course  Procedures (including critical care time)   Labs Reviewed  URINALYSIS, ROUTINE W REFLEX MICROSCOPIC   No results found.   No diagnosis found.    MDM  Discussed labs and CT with pt and family members.  Pt will f/u with her PCP, dr. Billie Lade Davenport.        Worthy Rancher, PA 12/26/10 [phone]

## 2010-12-26 NOTE — ED Provider Notes (Signed)
Medical screening examination/treatment/procedure(s) were performed by non-physician practitioner and as supervising physician I was immediately available for consultation/collaboration.   Rainah Kirshner W. Lanyla Costello, MD 12/26/10 1700 

## 2010-12-26 NOTE — ED Notes (Signed)
Pt c/o left side pain x 2-3 weeks.  Denies any urinary symptoms but says has history of kidney infection.  Denies history of kidney stones.  Denies any n/v.  States pain is worse with movement.

## 2010-12-26 NOTE — ED Notes (Signed)
Al Decant PA made aware of CT results back

## 2011-04-07 ENCOUNTER — Encounter (HOSPITAL_COMMUNITY): Payer: Self-pay | Admitting: Emergency Medicine

## 2011-04-07 ENCOUNTER — Emergency Department (HOSPITAL_COMMUNITY)
Admission: EM | Admit: 2011-04-07 | Discharge: 2011-04-07 | Disposition: A | Discharge status: Home / Self Care | Payer: Medicare Other | Attending: Emergency Medicine | Admitting: Emergency Medicine

## 2011-04-07 ENCOUNTER — Emergency Department (HOSPITAL_COMMUNITY): Payer: Medicare Other

## 2011-04-07 DIAGNOSIS — W1789XA Other fall from one level to another, initial encounter: Secondary | ICD-10-CM | POA: Insufficient documentation

## 2011-04-07 DIAGNOSIS — M79652 Pain in left thigh: Secondary | ICD-10-CM

## 2011-04-07 DIAGNOSIS — Z862 Personal history of diseases of the blood and blood-forming organs and certain disorders involving the immune mechanism: Secondary | ICD-10-CM | POA: Insufficient documentation

## 2011-04-07 DIAGNOSIS — Z7982 Long term (current) use of aspirin: Secondary | ICD-10-CM | POA: Insufficient documentation

## 2011-04-07 DIAGNOSIS — M25559 Pain in unspecified hip: Secondary | ICD-10-CM | POA: Insufficient documentation

## 2011-04-07 DIAGNOSIS — W19XXXA Unspecified fall, initial encounter: Secondary | ICD-10-CM

## 2011-04-07 HISTORY — DX: Anemia, unspecified: D64.9

## 2011-04-07 LAB — BASIC METABOLIC PANEL
BUN: 17 mg/dL (ref 6–23)
Chloride: 104 mEq/L (ref 96–112)
GFR calc Af Amer: 40 mL/min — ABNORMAL LOW (ref >90)
Glucose, Bld: 125 mg/dL — ABNORMAL HIGH (ref 70–99)
Potassium: 4.3 mEq/L (ref 3.5–5.1)
Sodium: 139 mEq/L (ref 135–145)

## 2011-04-07 LAB — CBC
HCT: 31.5 % — ABNORMAL LOW (ref 36.0–46.0)
Hemoglobin: 10.2 g/dL — ABNORMAL LOW (ref 12.0–15.0)
RDW: 15.4 % (ref 11.5–15.5)
WBC: 8.8 10*3/uL (ref 4.0–10.5)

## 2011-04-07 MED ORDER — HYDROCODONE-ACETAMINOPHEN 5-325 MG PO TABS: 1.0000 | ORAL_TABLET | Freq: Four times a day (QID) | ORAL | Status: AC | PRN

## 2011-04-07 MED ORDER — SODIUM CHLORIDE 0.9 % IV BOLUS (SEPSIS)
250.0000 mL | Freq: Once | INTRAVENOUS | Status: AC
  Administered 2011-04-07: 250 mL via INTRAVENOUS

## 2011-04-07 MED ORDER — HYDROMORPHONE HCL PF 1 MG/ML IJ SOLN
0.5000 mg | Freq: Once | INTRAMUSCULAR | Status: AC
  Administered 2011-04-07: 0.5 mg via INTRAVENOUS
  Filled 2011-04-07: qty 1

## 2011-04-07 MED ORDER — SODIUM CHLORIDE 0.9 % IV SOLN
INTRAVENOUS | Status: DC
  Administered 2011-04-07: 01:00:00 via INTRAVENOUS

## 2011-04-07 MED ORDER — ONDANSETRON HCL 4 MG/2ML IJ SOLN
4.0000 mg | Freq: Once | INTRAMUSCULAR | Status: AC
  Administered 2011-04-07: 4 mg via INTRAVENOUS
  Filled 2011-04-07: qty 2

## 2011-04-07 MED ORDER — MORPHINE SULFATE 2 MG/ML IJ SOLN
2.0000 mg | Freq: Once | INTRAMUSCULAR | Status: AC
  Administered 2011-04-07: 2 mg via INTRAVENOUS
  Filled 2011-04-07: qty 1

## 2011-04-07 NOTE — ED Notes (Signed)
Patient states she fell when getting out of her car about 3 hours ago. States she tripped and denies hitting head. Complaining of left upper leg pain and left hip pain. States it hurts to move it certain ways.

## 2011-04-07 NOTE — ED Provider Notes (Signed)
History     CSN: [id number]  Arrival date & time 04/07/11  0021   First MD Initiated Contact with Patient 04/07/11 0034      Chief Complaint  Patient presents with  . Fall  . Hip Pain    (Consider location/radiation/quality/duration/timing/severity/associated sxs/prior treatment) Patient is a 78 y.o. female presenting with fall and hip pain. The history is provided by the patient, the EMS personnel and a relative.  Fall The accident occurred 3 to 5 hours ago. The fall occurred while walking. She fell from a height of 3 to 5 ft. Impact surface: stumbled getting out of the car and fell backwards. The point of impact was the left hip (left thigh). The pain is at a severity of 8/10. The pain is moderate. She was not ambulatory at the scene. There was no entrapment after the fall. Pertinent negatives include no visual change, no fever, no abdominal pain, no nausea, no vomiting, no headaches, no loss of consciousness and no tingling. Exacerbated by: extension of left lower leg. She has tried nothing for the symptoms.  Hip Pain Pertinent negatives include no chest pain, no abdominal pain, no headaches and no shortness of breath.   The patient was getting out of the car stumbled backwards no loss of consciousness only complaint is pain to the left distal by initially thought to be maybe some hip pain as well. Not able to completely straighten the leg out actively. Denies any other injury.   Past Medical History  Diagnosis Date  . Hypotension   . Anemia     Past Surgical History  Procedure Date  . Abdominal surgery     History reviewed. No pertinent family history.  History  Substance Use Topics  . Smoking status: Never Smoker   . Smokeless tobacco: Not on file  . Alcohol Use: No    OB History    Grav Para Term Preterm Abortions TAB SAB Ect Mult Living                  Review of Systems  Constitutional: Negative for fever and chills.  HENT: Negative for congestion and neck  pain.   Eyes: Negative for visual disturbance.  Respiratory: Negative for shortness of breath.   Cardiovascular: Negative for chest pain.  Gastrointestinal: Negative for nausea, vomiting and abdominal pain.  Genitourinary: Negative for dysuria.  Musculoskeletal: Negative for back pain and joint swelling.  Skin: Negative for rash and wound.  Neurological: Negative for tingling, loss of consciousness and headaches.  Hematological: Does not bruise/bleed easily.    Allergies  Review of patient's allergies indicates no known allergies.  Home Medications   Current Outpatient Rx  Name Route Sig Dispense Refill  . ASPIRIN 325 MG PO TABS Oral Take 325 mg by mouth every 4 (four) hours as needed.    . ACETAMINOPHEN 500 MG PO TABS Oral Take 500 mg by mouth every 6 (six) hours as needed. For pain     . HYDROCODONE-ACETAMINOPHEN 5-325 MG PO TABS Oral Take 1-2 tablets by mouth every 6 (six) hours as needed for pain. 14 tablet 0  . VITAMIN B1-B12 IM Intramuscular Inject 1 mL into the muscle every 30 (thirty) days.        BP 186/78  Pulse 97  Temp(Src) 98.5 F (36.9 C) (Oral)  Resp 18  Ht 5\' 7"  (1.702 m)  Wt 145 lb (65.772 kg)  BMI 22.71 kg/m2  SpO2 98%  Physical Exam  Nursing note and vitals reviewed. Constitutional:  She is oriented to person, place, and time. She appears well-developed and well-nourished. No distress.  HENT:  Head: Normocephalic and atraumatic.  Mouth/Throat: Oropharynx is clear and moist.  Eyes: Conjunctivae and EOM are normal. Pupils are equal, round, and reactive to light.  Neck: Normal range of motion. Neck supple.  Cardiovascular: Normal rate, regular rhythm and normal heart sounds.   No murmur heard. Pulmonary/Chest: Breath sounds normal. No respiratory distress. She exhibits no tenderness.  Abdominal: Soft. Bowel sounds are normal. There is no tenderness.  Musculoskeletal: Normal range of motion. She exhibits no edema.       Normal except for subjective  complaint of pain to the distal left thigh left knee without effusion patella not dislocated able to passively extend the leg at the knee. She prefers to keep the knee flexed and the hip flexed. Distally neuro circulatory is intact.  Neurological: She is alert and oriented to person, place, and time. No cranial nerve deficit. She exhibits normal muscle tone.  Skin: Skin is warm. No rash noted. No erythema.    ED Course  Procedures (including critical care time)  Labs Reviewed  CBC - Abnormal; Notable for the following:    Hemoglobin 10.2 (*)    HCT 31.5 (*)    MCV 68.8 (*)    MCH 22.3 (*)    All other components within normal limits  BASIC METABOLIC PANEL - Abnormal; Notable for the following:    Glucose, Bld 125 (*)    Creatinine, Ser 1.34 (*)    GFR calc non Af Amer 35 (*)    GFR calc Af Amer 40 (*)    All other components within normal limits   Dg Hip Complete Left  04/07/2011  *RADIOLOGY REPORT*  Clinical Data: Severe left hip pain secondary to a fall today.  LEFT HIP - COMPLETE 2+ VIEW  Comparison: CT abdomen and pelvis dated 12/26/2010  Findings: There is no fracture or dislocation.  Mild degenerative changes of the left hip joint.  Degenerative changes at the symphysis pubis and in the lower lumbar spine.  IMPRESSION: No acute abnormality.  Original Report Authenticated By: Gwynn Burly, M.D.   Dg Femur Left  04/07/2011  *RADIOLOGY REPORT*  Clinical Data: Severe pain secondary to a fall.  LEFT FEMUR - 2 VIEW  Comparison: Hip radiographs dated 04/07/2011  Findings: The femur is intact.  Extensive chondrocalcinosis of the left knee joint.  Vascular calcification in the distal thigh.  No effusion.  IMPRESSION: No acute abnormality.  Original Report Authenticated By: Gwynn Burly, M.D.     1. Fall   2. Pain of left thigh       MDM   Status post fall getting out of the car no loss of consciousness no other injuries other than to the left thigh. X-rays negative for left  hip fracture and femur fracture patient does not really have pain in the hip area after careful assessment she's able to flex her leg at the hip without any discomfort. Most of the pain is in the distal femur area posteriorly suggestive of a pulled muscle or muscle cramp but no evidence of palpable spasm. Patient is able to extend her leg passively, but not actively. Improves some with pain medicine.         Shelda Jakes, MD 04/07/11 [phone]

## 2012-12-17 ENCOUNTER — Other Ambulatory Visit: Payer: Self-pay | Admitting: Ophthalmology

## 2012-12-17 LAB — SEDIMENTATION RATE: Erythrocyte Sed Rate: 28 mm/hr (ref 0–30)

## 2014-01-11 ENCOUNTER — Emergency Department (HOSPITAL_COMMUNITY): Payer: Medicare Other

## 2014-01-11 ENCOUNTER — Emergency Department (HOSPITAL_COMMUNITY)
Admission: EM | Admit: 2014-01-11 | Discharge: 2014-01-11 | Disposition: A | Discharge status: Home / Self Care | Payer: Medicare Other | Attending: Emergency Medicine | Admitting: Emergency Medicine

## 2014-01-11 ENCOUNTER — Encounter (HOSPITAL_COMMUNITY): Payer: Self-pay | Admitting: *Deleted

## 2014-01-11 DIAGNOSIS — Z87891 Personal history of nicotine dependence: Secondary | ICD-10-CM | POA: Diagnosis not present

## 2014-01-11 DIAGNOSIS — R0981 Nasal congestion: Secondary | ICD-10-CM | POA: Diagnosis not present

## 2014-01-11 DIAGNOSIS — R0789 Other chest pain: Secondary | ICD-10-CM | POA: Insufficient documentation

## 2014-01-11 DIAGNOSIS — R05 Cough: Secondary | ICD-10-CM | POA: Insufficient documentation

## 2014-01-11 DIAGNOSIS — I10 Essential (primary) hypertension: Secondary | ICD-10-CM | POA: Diagnosis not present

## 2014-01-11 DIAGNOSIS — R059 Cough, unspecified: Secondary | ICD-10-CM

## 2014-01-11 MED ORDER — HYDROCHLOROTHIAZIDE 12.5 MG PO CAPS
12.5000 mg | ORAL_CAPSULE | Freq: Every day | ORAL | Status: DC
  Administered 2014-01-11: 12.5 mg via ORAL
  Filled 2014-01-11 (×2): qty 1

## 2014-01-11 MED ORDER — HYDROCHLOROTHIAZIDE 12.5 MG PO TABS: 12.5000 mg | ORAL_TABLET | Freq: Every day | ORAL | Status: DC

## 2014-01-11 NOTE — ED Notes (Signed)
Informed Dr. Criss AlvineGoldston of pt's high BP, taken automatically and manually.

## 2014-01-11 NOTE — ED Notes (Signed)
Sneezing, coughing,  No fever.  No NVD   Pain lt  Flank intermittently for "several months"

## 2014-01-11 NOTE — Discharge Instructions (Signed)
Cough, Adult  A cough is a reflex that helps clear your throat and airways. It can help heal the body or may be a reaction to an irritated airway. A cough may only last 2 or 3 weeks (acute) or may last more than 8 weeks (chronic).  CAUSES Acute cough:  Viral or bacterial infections. Chronic cough:  Infections.  Allergies.  Asthma.  Post-nasal drip.  Smoking.  Heartburn or acid reflux.  Some medicines.  Chronic lung problems (COPD).  Cancer. SYMPTOMS   Cough.  Fever.  Chest pain.  Increased breathing rate.  High-pitched whistling sound when breathing (wheezing).  Colored mucus that you cough up (sputum). TREATMENT   A bacterial cough may be treated with antibiotic medicine.  A viral cough must run its course and will not respond to antibiotics.  Your caregiver may recommend other treatments if you have a chronic cough. HOME CARE INSTRUCTIONS   Only take over-the-counter or prescription medicines for pain, discomfort, or fever as directed by your caregiver. Use cough suppressants only as directed by your caregiver.  Use a cold steam vaporizer or humidifier in your bedroom or home to help loosen secretions.  Sleep in a semi-upright position if your cough is worse at night.  Rest as needed.  Stop smoking if you smoke. SEEK IMMEDIATE MEDICAL CARE IF:   You have pus in your sputum.  Your cough starts to worsen.  You cannot control your cough with suppressants and are losing sleep.  You begin coughing up blood.  You have difficulty breathing.  You develop pain which is getting worse or is uncontrolled with medicine.  You have a fever. MAKE SURE YOU:   Understand these instructions.  Will watch your condition.  Will get help right away if you are not doing well or get worse. Document Released: 08/18/2010 Document Revised: 05/14/2011 Document Reviewed: 08/18/2010 [facility] Patient Information 2015 [place], [place]. This information is not intended  to replace advice given to you by your health care provider. Make sure you discuss any questions you have with your health care provider.    Hypertension Hypertension, commonly called high blood pressure, is when the force of blood pumping through your arteries is too strong. Your arteries are the blood vessels that carry blood from your heart throughout your body. A blood pressure reading consists of a higher number over a lower number, such as 110/72. The higher number (systolic) is the pressure inside your arteries when your heart pumps. The lower number (diastolic) is the pressure inside your arteries when your heart relaxes. Ideally you want your blood pressure below 120/80. Hypertension forces your heart to work harder to pump blood. Your arteries may become narrow or stiff. Having hypertension puts you at risk for heart disease, stroke, and other problems.  RISK FACTORS Some risk factors for high blood pressure are controllable. Others are not.  Risk factors you cannot control include:   Race. You may be at higher risk if you are African American.  Age. Risk increases with age.  Gender. Men are at higher risk than women before age 78 years. After age 78, women are at higher risk than men. Risk factors you can control include:  Not getting enough exercise or physical activity.  Being overweight.  Getting too much fat, sugar, calories, or salt in your diet.  Drinking too much alcohol. SIGNS AND SYMPTOMS Hypertension does not usually cause signs or symptoms. Extremely high blood pressure (hypertensive crisis) may cause headache, anxiety, shortness of breath, and nosebleed.  DIAGNOSIS  To check if you have hypertension, your health care provider will measure your blood pressure while you are seated, with your arm held at the level of your heart. It should be measured at least twice using the same arm. Certain conditions can cause a difference in blood pressure between your right and  left arms. A blood pressure reading that is higher than normal on one occasion does not mean that you need treatment. If one blood pressure reading is high, ask your health care provider about having it checked again. TREATMENT  Treating high blood pressure includes making lifestyle changes and possibly taking medicine. Living a healthy lifestyle can help lower high blood pressure. You may need to change some of your habits. Lifestyle changes may include:  Following the DASH diet. This diet is high in fruits, vegetables, and whole grains. It is low in salt, red meat, and added sugars.  Getting at least 2 hours of brisk physical activity every week.  Losing weight if necessary.  Not smoking.  Limiting alcoholic beverages.  Learning ways to reduce stress. If lifestyle changes are not enough to get your blood pressure under control, your health care provider may prescribe medicine. You may need to take more than one. Work closely with your health care provider to understand the risks and benefits. HOME CARE INSTRUCTIONS  Have your blood pressure rechecked as directed by your health care provider.   Take medicines only as directed by your health care provider. Follow the directions carefully. Blood pressure medicines must be taken as prescribed. The medicine does not work as well when you skip doses. Skipping doses also puts you at risk for problems.   Do not smoke.   Monitor your blood pressure at home as directed by your health care provider. SEEK MEDICAL CARE IF:   You think you are having a reaction to medicines taken.  You have recurrent headaches or feel dizzy.  You have swelling in your ankles.  You have trouble with your vision. SEEK IMMEDIATE MEDICAL CARE IF:  You develop a severe headache or confusion.  You have unusual weakness, numbness, or feel faint.  You have severe chest or abdominal pain.  You vomit repeatedly.  You have trouble breathing. MAKE SURE  YOU:   Understand these instructions.  Will watch your condition.  Will get help right away if you are not doing well or get worse. Document Released: 02/19/2005 Document Revised: 07/06/2013 Document Reviewed: 12/12/2012 [facility] Patient Information 2015 [place], [place]. This information is not intended to replace advice given to you by your health care provider. Make sure you discuss any questions you have with your health care provider.    Hydrochlorothiazide, HCTZ capsules or tablets What is this medicine? HYDROCHLOROTHIAZIDE (hye droe klor oh THYE a zide) is a diuretic. It increases the amount of urine passed, which causes the body to lose salt and water. This medicine is used to treat high blood pressure. It is also reduces the swelling and water retention caused by various medical conditions, such as heart, liver, or kidney disease. This medicine may be used for other purposes; ask your health care provider or pharmacist if you have questions. COMMON BRAND NAME(S): Esidrix, Ezide, HydroDIURIL, Microzide, Oretic, Zide What should I tell my health care provider before I take this medicine? They need to know if you have any of these conditions: -diabetes -gout -immune system problems, like lupus -kidney disease or kidney stones -liver disease -pancreatitis -small amount of urine or difficulty passing  urine -an unusual or allergic reaction to hydrochlorothiazide, sulfa drugs, other medicines, foods, dyes, or preservatives -pregnant or trying to get pregnant -breast-feeding How should I use this medicine? Take this medicine by mouth with a glass of water. Follow the directions on the prescription label. Take your medicine at regular intervals. Remember that you will need to pass urine frequently after taking this medicine. Do not take your doses at a time of day that will cause you problems. Do not stop taking your medicine unless your doctor tells you to. Talk to your pediatrician  regarding the use of this medicine in children. Special care may be needed. Overdosage: If you think you have taken too much of this medicine contact a poison control center or emergency room at once. NOTE: This medicine is only for you. Do not share this medicine with others. What if I miss a dose? If you miss a dose, take it as soon as you can. If it is almost time for your next dose, take only that dose. Do not take double or extra doses. What may interact with this medicine? -cholestyramine -colestipol -digoxin -dofetilide -lithium -medicines for blood pressure -medicines for diabetes -medicines that relax muscles for surgery -other diuretics -steroid medicines like prednisone or cortisone This list may not describe all possible interactions. Give your health care provider a list of all the medicines, herbs, non-prescription drugs, or dietary supplements you use. Also tell them if you smoke, drink alcohol, or use illegal drugs. Some items may interact with your medicine. What should I watch for while using this medicine? Visit your doctor or health care professional for regular checks on your progress. Check your blood pressure as directed. Ask your doctor or health care professional what your blood pressure should be and when you should contact him or her. You may need to be on a special diet while taking this medicine. Ask your doctor. Check with your doctor or health care professional if you get an attack of severe diarrhea, nausea and vomiting, or if you sweat a lot. The loss of too much body fluid can make it dangerous for you to take this medicine. You may get drowsy or dizzy. Do not drive, use machinery, or do anything that needs mental alertness until you know how this medicine affects you. Do not stand or sit up quickly, especially if you are an older patient. This reduces the risk of dizzy or fainting spells. Alcohol may interfere with the effect of this medicine. Avoid alcoholic  drinks. This medicine may affect your blood sugar level. If you have diabetes, check with your doctor or health care professional before changing the dose of your diabetic medicine. This medicine can make you more sensitive to the sun. Keep out of the sun. If you cannot avoid being in the sun, wear protective clothing and use sunscreen. Do not use sun lamps or tanning beds/booths. What side effects may I notice from receiving this medicine? Side effects that you should report to your doctor or health care professional as soon as possible: -allergic reactions such as skin rash or itching, hives, swelling of the lips, mouth, tongue, or throat -changes in vision -chest pain -eye pain -fast or irregular heartbeat -feeling faint or lightheaded, falls -gout attack -muscle pain or cramps -pain or difficulty when passing urine -pain, tingling, numbness in the hands or feet -redness, blistering, peeling or loosening of the skin, including inside the mouth -unusually weak or tired Side effects that usually do not require medical  attention (report to your doctor or health care professional if they continue or are bothersome): -change in sex drive or performance -dry mouth -headache -stomach upset This list may not describe all possible side effects. Call your doctor for medical advice about side effects. You may report side effects to FDA at 1-800-FDA-1088. Where should I keep my medicine? Keep out of the reach of children. Store at room temperature between 15 and 30 degrees C (59 and 86 degrees F). Do not freeze. Protect from light and moisture. Keep container closed tightly. Throw away any unused medicine after the expiration date. NOTE: This sheet is a summary. It may not cover all possible information. If you have questions about this medicine, talk to your doctor, pharmacist, or health care provider.  2015, [employer]. (2009-10-14 12:57:37)

## 2014-01-11 NOTE — ED Notes (Signed)
MD at bedside. 

## 2014-01-11 NOTE — ED Provider Notes (Signed)
CSN: [id number]     Arrival date & time 01/11/14  1127 History  This chart was scribed for Ashley CamelScott T Mollie Rossano, MD by Abel PrestoKara Demonbreun, ED Scribe. This patient was seen in room APA05/APA05 and the patient's care was started at 12:36 PM.     Chief Complaint  Patient presents with  . Cough     Patient is a 80 y.o. female presenting with cough.  Cough Associated symptoms: rhinorrhea   Associated symptoms: no fever and no shortness of breath    HPI Comments: Ashley Goodwin is a 80 y.o. female who presents to the Emergency Department complaining of an unproductive cough with onset 3 days ago. Pt notes she has associated rhinorrhea, congestion at night and vomiting. Pt has surgical history of partial colectomy in 2002 and surgery after esophageal rupture. She states she has side pain in area of surgery, but that has bothered her for several months. She denies dysuria, hematuria, dyspnea and chest pain.   History reviewed. No pertinent past medical history. Past Surgical History  Procedure Laterality Date  . Abdominal surgery    . Esophagus burst     History reviewed. No pertinent family history. History  Substance Use Topics  . Smoking status: Former Games developermoker  . Smokeless tobacco: Not on file  . Alcohol Use: No   OB History    No data available     Review of Systems  Constitutional: Negative for fever.  HENT: Positive for congestion and rhinorrhea.   Respiratory: Positive for cough. Negative for shortness of breath.   Gastrointestinal: Negative for vomiting and blood in stool.  Genitourinary: Negative for dysuria and hematuria.  Musculoskeletal:       Left chest wall pain  All other systems reviewed and are negative.     Allergies  Review of patient's allergies indicates no known allergies.  Home Medications   Prior to Admission medications   Not on File   BP 198/98 mmHg  Pulse 72  Temp(Src) 98.3 F (36.8 C) (Oral)  Resp 18  Ht 5\' 7"  (1.702 m)  Wt 128 lb (58.06 kg)   BMI 20.04 kg/m2  SpO2 100% Physical Exam  Constitutional: She is oriented to person, place, and time. She appears well-developed and well-nourished.  HENT:  Head: Normocephalic and atraumatic.  Right Ear: External ear normal.  Left Ear: External ear normal.  Nose: Nose normal.  Mouth/Throat: Oropharynx is clear and moist. No oropharyngeal exudate.  Eyes: Right eye exhibits no discharge. Left eye exhibits no discharge.  Neck: Normal range of motion. Neck supple.  Cardiovascular: Normal rate, regular rhythm and normal heart sounds.   Pulmonary/Chest: Effort normal and breath sounds normal. No respiratory distress. She has no wheezes. She has no rales.    Musculoskeletal: She exhibits no edema.  Neurological: She is alert and oriented to person, place, and time.  Skin: Skin is warm and dry. No erythema.  Psychiatric: She has a normal mood and affect. Her behavior is normal.  Nursing note and vitals reviewed.   ED Course  Procedures (including critical care time)  DIAGNOSTIC STUDIES: Oxygen Saturation is 100% on room air, normal by my interpretation.    COORDINATION OF CARE: 12:40 PM Discussed treatment plan with patient at beside including CXR, the patient agrees with the plan and has no further questions at this time.   Labs Review Labs Reviewed - No data to display  Imaging Review Dg Chest 2 View  01/11/2014   CLINICAL DATA:  Dry cough, runny nose  and sneezing since Friday, former smoker  EXAM: CHEST  2 VIEW  COMPARISON:  None  FINDINGS: Mild enlargement of cardiac silhouette.  Calcified tortuous thoracic aorta.  Mediastinal contours and pulmonary vascularity otherwise normal.  Emphysematous and mild bronchitic changes consistent with COPD.  Linear subsegmental atelectasis versus scarring in lingula.  No acute infiltrate, pleural effusion or pneumothorax.  Postsurgical changes of LEFT thoracotomy.  Bones demineralized with BILATERAL AC joint degenerative changes and scattered  degenerative disc disease changes of the thoracolumbar spine.  Old appearing height loss of L1 vertebral body.  IMPRESSION: Changes of COPD and prior LEFT thoracotomy with atelectasis versus scarring in lingula.  Enlargement of cardiac silhouette.  No additional acute abnormalities.   Electronically Signed   By: Ulyses SouthwardMark  Boles M.D.   On: 01/11/2014 13:15     EKG Interpretation None      MDM   Final diagnoses:  Cough  Essential hypertension   Patient's cough is likely from viral URI. No increased WOB, hypoxia or other concerning findings to suggest more severe disease. Also has asymptomatic HTN with SBP 190-200 here. Denies prior history of HTN. Discussed options, will start on anti-hypertensive here, and stressed need for close outpatient f/u for BP control. Her left flank pain is not currently present and she also is amenable to outpatient w/u for this.  I personally performed the services described in this documentation, which was scribed in my presence. The recorded information has been reviewed and is accurate.    Ashley CamelScott T [name], MD 01/11/14 1539

## 2014-01-13 ENCOUNTER — Emergency Department: Payer: Self-pay | Admitting: Student

## 2014-01-13 LAB — COMPREHENSIVE METABOLIC PANEL
ALBUMIN: 3.4 g/dL (ref 3.4–5.0)
ALK PHOS: 126 U/L — AB
ALT: 19 U/L
AST: 16 U/L (ref 15–37)
Anion Gap: 6 — ABNORMAL LOW (ref 7–16)
BUN: 22 mg/dL — ABNORMAL HIGH (ref 7–18)
Bilirubin,Total: 0.3 mg/dL (ref 0.2–1.0)
CALCIUM: 8.9 mg/dL (ref 8.5–10.1)
CHLORIDE: 106 mmol/L (ref 98–107)
CREATININE: 1.52 mg/dL — AB (ref 0.60–1.30)
Co2: 28 mmol/L (ref 21–32)
GFR CALC AF AMER: 41 — AB
GFR CALC NON AF AMER: 34 — AB
GLUCOSE: 101 mg/dL — AB (ref 65–99)
OSMOLALITY: 283 (ref 275–301)
POTASSIUM: 5.2 mmol/L — AB (ref 3.5–5.1)
Sodium: 140 mmol/L (ref 136–145)
TOTAL PROTEIN: 7.7 g/dL (ref 6.4–8.2)

## 2014-01-13 LAB — URINALYSIS, COMPLETE
BILIRUBIN, UR: NEGATIVE
BLOOD: NEGATIVE
Bacteria: NONE SEEN
Glucose,UR: NEGATIVE mg/dL (ref 0–75)
KETONE: NEGATIVE
Nitrite: NEGATIVE
Ph: 5 (ref 4.5–8.0)
Protein: 30
RBC,UR: 6 /HPF (ref 0–5)
SPECIFIC GRAVITY: 1.017 (ref 1.003–1.030)

## 2014-01-13 LAB — CBC
HCT: 36.3 % (ref 35.0–47.0)
HGB: 11.2 g/dL — ABNORMAL LOW (ref 12.0–16.0)
MCH: 22.3 pg — ABNORMAL LOW (ref 26.0–34.0)
MCHC: 30.8 g/dL — ABNORMAL LOW (ref 32.0–36.0)
MCV: 72 fL — ABNORMAL LOW (ref 80–100)
PLATELETS: 223 10*3/uL (ref 150–440)
RBC: 5.02 10*6/uL (ref 3.80–5.20)
RDW: 15.2 % — ABNORMAL HIGH (ref 11.5–14.5)
WBC: 6.8 10*3/uL (ref 3.6–11.0)

## 2014-01-13 LAB — LIPASE, BLOOD: Lipase: 87 U/L (ref 73–393)

## 2014-02-16 ENCOUNTER — Encounter (HOSPITAL_COMMUNITY): Payer: Self-pay | Admitting: Emergency Medicine

## 2014-06-21 LAB — HM MAMMOGRAPHY: HM Mammogram: NEGATIVE

## 2015-07-11 ENCOUNTER — Encounter (HOSPITAL_COMMUNITY): Payer: Self-pay | Admitting: *Deleted

## 2015-07-11 ENCOUNTER — Emergency Department (HOSPITAL_COMMUNITY): Payer: Medicare Other

## 2015-07-11 ENCOUNTER — Emergency Department (HOSPITAL_COMMUNITY)
Admission: EM | Admit: 2015-07-11 | Discharge: 2015-07-11 | Disposition: A | Discharge status: Home / Self Care | Payer: Medicare Other | Attending: Emergency Medicine | Admitting: Emergency Medicine

## 2015-07-11 DIAGNOSIS — N289 Disorder of kidney and ureter, unspecified: Secondary | ICD-10-CM | POA: Insufficient documentation

## 2015-07-11 DIAGNOSIS — I714 Abdominal aortic aneurysm, without rupture, unspecified: Secondary | ICD-10-CM

## 2015-07-11 DIAGNOSIS — M542 Cervicalgia: Secondary | ICD-10-CM | POA: Diagnosis present

## 2015-07-11 DIAGNOSIS — Z7982 Long term (current) use of aspirin: Secondary | ICD-10-CM | POA: Diagnosis not present

## 2015-07-11 DIAGNOSIS — Z87891 Personal history of nicotine dependence: Secondary | ICD-10-CM | POA: Diagnosis not present

## 2015-07-11 DIAGNOSIS — Z862 Personal history of diseases of the blood and blood-forming organs and certain disorders involving the immune mechanism: Secondary | ICD-10-CM | POA: Diagnosis not present

## 2015-07-11 DIAGNOSIS — N390 Urinary tract infection, site not specified: Secondary | ICD-10-CM | POA: Diagnosis not present

## 2015-07-11 LAB — COMPREHENSIVE METABOLIC PANEL
ALBUMIN: 3.8 g/dL (ref 3.5–5.0)
ALT: 13 U/L — ABNORMAL LOW (ref 14–54)
ANION GAP: 12 (ref 5–15)
AST: 22 U/L (ref 15–41)
Alkaline Phosphatase: 81 U/L (ref 38–126)
BUN: 46 mg/dL — AB (ref 6–20)
CHLORIDE: 102 mmol/L (ref 101–111)
CO2: 25 mmol/L (ref 22–32)
Calcium: 9.2 mg/dL (ref 8.9–10.3)
Creatinine, Ser: 2.1 mg/dL — ABNORMAL HIGH (ref 0.44–1.00)
GFR calc Af Amer: 23 mL/min — ABNORMAL LOW (ref >60)
GFR calc non Af Amer: 19 mL/min — ABNORMAL LOW (ref >60)
GLUCOSE: 114 mg/dL — AB (ref 65–99)
POTASSIUM: 5 mmol/L (ref 3.5–5.1)
SODIUM: 139 mmol/L (ref 135–145)
Total Bilirubin: 0.5 mg/dL (ref 0.3–1.2)
Total Protein: 7.2 g/dL (ref 6.5–8.1)

## 2015-07-11 LAB — URINALYSIS, ROUTINE W REFLEX MICROSCOPIC
Bilirubin Urine: NEGATIVE
Glucose, UA: NEGATIVE mg/dL
HGB URINE DIPSTICK: NEGATIVE
Ketones, ur: NEGATIVE mg/dL
Nitrite: NEGATIVE
Protein, ur: 30 mg/dL — AB
SPECIFIC GRAVITY, URINE: 1.023 (ref 1.005–1.030)
pH: 5 (ref 5.0–8.0)

## 2015-07-11 LAB — CBC WITH DIFFERENTIAL/PLATELET
BASOS ABS: 0 10*3/uL (ref 0.0–0.1)
Basophils Relative: 0 %
EOS ABS: 0.2 10*3/uL (ref 0.0–0.7)
Eosinophils Relative: 2 %
HEMATOCRIT: 34.6 % — AB (ref 36.0–46.0)
Hemoglobin: 10.7 g/dL — ABNORMAL LOW (ref 12.0–15.0)
LYMPHS ABS: 3.5 10*3/uL (ref 0.7–4.0)
Lymphocytes Relative: 48 %
MCH: 22 pg — ABNORMAL LOW (ref 26.0–34.0)
MCHC: 30.9 g/dL (ref 30.0–36.0)
MCV: 71.2 fL — ABNORMAL LOW (ref 78.0–100.0)
MONO ABS: 0.8 10*3/uL (ref 0.1–1.0)
MONOS PCT: 10 %
Neutro Abs: 3 10*3/uL (ref 1.7–7.7)
Neutrophils Relative %: 40 %
PLATELETS: 174 10*3/uL (ref 150–400)
RBC: 4.86 MIL/uL (ref 3.87–5.11)
RDW: 14.8 % (ref 11.5–15.5)
WBC: 7.5 10*3/uL (ref 4.0–10.5)

## 2015-07-11 LAB — I-STAT TROPONIN, ED: Troponin i, poc: 0 ng/mL (ref 0.00–0.08)

## 2015-07-11 LAB — I-STAT CHEM 8, ED
BUN: 45 mg/dL — AB (ref 6–20)
CALCIUM ION: 1.15 mmol/L (ref 1.13–1.30)
CHLORIDE: 104 mmol/L (ref 101–111)
Creatinine, Ser: 2 mg/dL — ABNORMAL HIGH (ref 0.44–1.00)
Glucose, Bld: 106 mg/dL — ABNORMAL HIGH (ref 65–99)
HEMATOCRIT: 39 % (ref 36.0–46.0)
Hemoglobin: 13.3 g/dL (ref 12.0–15.0)
Potassium: 5.1 mmol/L (ref 3.5–5.1)
SODIUM: 141 mmol/L (ref 135–145)
TCO2: 27 mmol/L (ref 0–100)

## 2015-07-11 LAB — URINE MICROSCOPIC-ADD ON: RBC / HPF: NONE SEEN RBC/hpf (ref 0–5)

## 2015-07-11 LAB — LIPASE, BLOOD: Lipase: 33 U/L (ref 11–51)

## 2015-07-11 MED ORDER — SODIUM CHLORIDE 0.9 % IV BOLUS (SEPSIS)
1000.0000 mL | Freq: Once | INTRAVENOUS | Status: AC
  Administered 2015-07-11: 1000 mL via INTRAVENOUS

## 2015-07-11 MED ORDER — CEPHALEXIN 500 MG PO CAPS: 500.0000 mg | ORAL_CAPSULE | Freq: Three times a day (TID) | ORAL | Status: DC

## 2015-07-11 MED ORDER — FENTANYL CITRATE (PF) 100 MCG/2ML IJ SOLN
25.0000 ug | Freq: Once | INTRAMUSCULAR | Status: AC
  Administered 2015-07-11: 25 ug via INTRAVENOUS
  Filled 2015-07-11: qty 2

## 2015-07-11 MED ORDER — DEXTROSE 5 % IV SOLN
1.0000 g | Freq: Once | INTRAVENOUS | Status: AC
  Administered 2015-07-11: 1 g via INTRAVENOUS
  Filled 2015-07-11: qty 10

## 2015-07-11 MED ORDER — TRAMADOL HCL 50 MG PO TABS: 50.0000 mg | ORAL_TABLET | Freq: Four times a day (QID) | ORAL | Status: DC | PRN

## 2015-07-11 NOTE — ED Notes (Signed)
Called for room, no answer

## 2015-07-11 NOTE — Discharge Instructions (Signed)
Take tylenol for pain.   Take keflex three times daily for a week.   Take tramadol for severe pain.   Your creatinine (kidney function), recheck kidney function in a week.  See your doctor in a week.   Your aneurysm will need to be followed with ultrasound.   Return to ER if you have severe pain, vomiting, fever.

## 2015-07-11 NOTE — ED Notes (Signed)
Pt called for room x 2. No response 

## 2015-07-11 NOTE — ED Provider Notes (Signed)
CSN: [id number]     Arrival date & time 07/11/15  1124 History   First MD Initiated Contact with Patient 07/11/15 1627     Chief Complaint  Patient presents with  . Back Pain     (Consider location/radiation/quality/duration/timing/severity/associated sxs/prior Treatment) The history is provided by the patient.  Ashley Goodwin is a 82 y.o. female hx of anemia, aortic aneurysm, perforated duodenal ulcer here with left flank pain. Left Flank pain for the last several months that is progressively getting worse. It is worse with movement but denies any fall or injury. Denies any pain when she urinates or any fevers or vomiting. She has a history of AAA but had no previous stents or grafts. Had hx of perforated ulcer that was operated on.       Past Medical History  Diagnosis Date  . Hypotension   . Anemia    Past Surgical History  Procedure Laterality Date  . Abdominal surgery    . Esophagus burst     History reviewed. No pertinent family history. Social History  Substance Use Topics  . Smoking status: Former Games developer  . Smokeless tobacco: None  . Alcohol Use: No   OB History    Gravida Para Term Preterm AB TAB SAB Ectopic Multiple Living   0 0 0 0 0 0 0 0       Review of Systems  Musculoskeletal: Positive for back pain.  All other systems reviewed and are negative.     Allergies  Review of patient's allergies indicates no known allergies.  Home Medications   Prior to Admission medications   Medication Sig Start Date End Date Taking? Authorizing Provider  acetaminophen (TYLENOL) 500 MG tablet Take 500 mg by mouth every 6 (six) hours as needed. For pain     Historical Provider, MD  aspirin 325 MG tablet Take 325 mg by mouth every 4 (four) hours as needed.    Historical Provider, MD  hydrochlorothiazide (HYDRODIURIL) 12.5 MG tablet Take 1 tablet (12.5 mg total) by mouth daily. 01/11/14   Pricilla Loveless, MD  VITAMIN B1-B12 IM Inject 1 mL into the muscle every 30 (thirty)  days.      Historical Provider, MD   BP 171/91 mmHg  Pulse 30  Temp(Src) 97.9 F (36.6 C) (Oral)  Resp 23  Ht  (1.702 m)  Wt 140 lb (63.504 kg)  BMI 21.92 kg/m2  SpO2 100% Physical Exam  Constitutional: She is oriented to person, place, and time. She appears well-developed and well-nourished.  Chronically ill, NAD   HENT:  Head: Normocephalic.  Mouth/Throat: Oropharynx is clear and moist.  Eyes: Conjunctivae are normal. Pupils are equal, round, and reactive to light.  Neck: Normal range of motion. Neck supple.  Cardiovascular: Normal rate, regular rhythm and normal heart sounds.   Pulmonary/Chest: Effort normal and breath sounds normal. No respiratory distress. She has no wheezes. She has no rales.  Abdominal: Soft. Bowel sounds are normal. She exhibits no distension. There is no tenderness. There is no rebound.  Mild L CVAT   Musculoskeletal: Normal range of motion. She exhibits no edema or tenderness.  Neurological: She is alert and oriented to person, place, and time. No cranial nerve deficit. Coordination normal.  Skin: Skin is warm and dry.  Psychiatric: She has a normal mood and affect. Her behavior is normal. Judgment and thought content normal.  Nursing note and vitals reviewed.   ED Course  Procedures (including critical care time) Labs Review Labs Reviewed  URINALYSIS, ROUTINE W REFLEX MICROSCOPIC (NOT AT [facility]) - Abnormal; Notable for the following:    Protein, ur 30 (*)    Leukocytes, UA SMALL (*)    All other components within normal limits  URINE MICROSCOPIC-ADD ON - Abnormal; Notable for the following:    Squamous Epithelial / LPF 0-5 (*)    Bacteria, UA FEW (*)    All other components within normal limits  CBC WITH DIFFERENTIAL/PLATELET - Abnormal; Notable for the following:    Hemoglobin 10.7 (*)    HCT 34.6 (*)    MCV 71.2 (*)    MCH 22.0 (*)    All other components within normal limits  COMPREHENSIVE METABOLIC PANEL - Abnormal; Notable for the  following:    Glucose, Bld 114 (*)    BUN 46 (*)    Creatinine, Ser 2.10 (*)    ALT 13 (*)    GFR calc non Af Amer 19 (*)    GFR calc Af Amer 23 (*)    All other components within normal limits  I-STAT CHEM 8, ED - Abnormal; Notable for the following:    BUN 45 (*)    Creatinine, Ser 2.00 (*)    Glucose, Bld 106 (*)    All other components within normal limits  URINE CULTURE  LIPASE, BLOOD  I-STAT TROPOININ, ED    Imaging Review Ct Renal Stone Study  07/11/2015  CLINICAL DATA:  Left flank pain EXAM: CT ABDOMEN AND PELVIS WITHOUT CONTRAST TECHNIQUE: Multidetector CT imaging of the abdomen and pelvis was performed following the standard protocol without IV contrast. COMPARISON:  12/26/2010 FINDINGS: Scarring noted at the left base. Right lung bases clear. No effusions. Heart is normal size. Liver, gallbladder, pancreas, spleen, adrenals have an unremarkable unenhanced appearance. No renal or ureteral stones. No hydronephrosis. 3 cm infrarenal abdominal aortic aneurysm, increased from 2.6 cm thin in 2012. Diffuse atherosclerotic calcifications throughout the aorta and iliac vessels. Uterus, adnexae a and urinary bladder unremarkable. Stomach, large and small bowel grossly unremarkable. No free fluid, free air or adenopathy. No acute bony abnormality. Diffuse degenerative disc disease throughout the lumbar spine. IMPRESSION: No renal or ureteral stones.  No hydronephrosis. Heavily calcified aorta and iliac vessels. Mild aneurysmal dilatation of the infrarenal aorta, 3.0 cm. Left basilar scarring. No acute findings in the abdomen or pelvis. Electronically Signed   By: Charlett Nose M.D.   On: 07/11/2015 20:31   I have personally reviewed and evaluated these images and lab results as part of my medical decision-making.   EKG Interpretation   Date/Time:  Monday Jul 11 2015 17:36:45 EDT Ventricular Rate:  68 PR Interval:  156 QRS Duration: 89 QT Interval:  418 QTC Calculation: 444 R Axis:    84 Text Interpretation:  Sinus rhythm Consider left atrial enlargement  Borderline right axis deviation No significant change since last tracing  Confirmed by YAO  MD, DAVID ([id number]) on 07/11/2015 5:52:51 PM      MDM   Final diagnoses:  None   Ashley Goodwin is a 82 y.o. female here with L flank pain. Consider renal colic vs pyelo vs AAA. Will get labs, UA, CT ab/pel.   6:40 pm  Cr 2.00 so can't get CT with contrast. Will get CT renal stone instead.   8:43 PM Cr 2.0, was 1.5 about 2 years ago. UA + UTI. Given ceftriaxone. WBC nl. Doesn't appear septic. Vitals stable. CT showed 3 cm stable AAA with no obvious rupture. I think symptoms likely  from UTI. Renal insufficiency can be due to age or UTI. Pain controlled. Wants to go home and has follow up. Will dc home with keflex, tylenol, tramadol prn. Will have her recheck BMP in a week.     Richardean Canalavid H Yao, MD 07/11/15 2044

## 2015-07-11 NOTE — ED Notes (Signed)
Pt reports left side back pain for extended amount of time. Pain increases with movement but also has occ pain with urination.

## 2015-07-13 LAB — URINE CULTURE

## 2016-05-09 ENCOUNTER — Emergency Department (HOSPITAL_COMMUNITY)
Admission: EM | Admit: 2016-05-09 | Discharge: 2016-05-09 | Disposition: A | Discharge status: Home / Self Care | Payer: Medicare Other | Attending: Emergency Medicine | Admitting: Emergency Medicine

## 2016-05-09 ENCOUNTER — Encounter (HOSPITAL_COMMUNITY): Payer: Self-pay | Admitting: *Deleted

## 2016-05-09 DIAGNOSIS — R200 Anesthesia of skin: Secondary | ICD-10-CM | POA: Insufficient documentation

## 2016-05-09 DIAGNOSIS — Z7982 Long term (current) use of aspirin: Secondary | ICD-10-CM | POA: Diagnosis not present

## 2016-05-09 DIAGNOSIS — Z87891 Personal history of nicotine dependence: Secondary | ICD-10-CM | POA: Diagnosis not present

## 2016-05-09 LAB — CBC
HCT: 33.9 % — ABNORMAL LOW (ref 36.0–46.0)
Hemoglobin: 10.7 g/dL — ABNORMAL LOW (ref 12.0–15.0)
MCH: 22.4 pg — ABNORMAL LOW (ref 26.0–34.0)
MCHC: 31.6 g/dL (ref 30.0–36.0)
MCV: 70.9 fL — ABNORMAL LOW (ref 78.0–100.0)
PLATELETS: 217 10*3/uL (ref 150–400)
RBC: 4.78 MIL/uL (ref 3.87–5.11)
RDW: 14.8 % (ref 11.5–15.5)
WBC: 6.2 10*3/uL (ref 4.0–10.5)

## 2016-05-09 LAB — BASIC METABOLIC PANEL
Anion gap: 6 (ref 5–15)
BUN: 22 mg/dL — ABNORMAL HIGH (ref 6–20)
CALCIUM: 8.9 mg/dL (ref 8.9–10.3)
CO2: 23 mmol/L (ref 22–32)
CREATININE: 1 mg/dL (ref 0.44–1.00)
Chloride: 104 mmol/L (ref 101–111)
GFR, EST AFRICAN AMERICAN: 55 mL/min — AB (ref >60)
GFR, EST NON AFRICAN AMERICAN: 47 mL/min — AB (ref >60)
GLUCOSE: 92 mg/dL (ref 65–99)
Potassium: 4.1 mmol/L (ref 3.5–5.1)
Sodium: 133 mmol/L — ABNORMAL LOW (ref 135–145)

## 2016-05-09 LAB — VITAMIN B12: Vitamin B-12: 288 pg/mL (ref 180–914)

## 2016-05-09 LAB — TYPE AND SCREEN
ABO/RH(D): A POS
ANTIBODY SCREEN: NEGATIVE

## 2016-05-09 NOTE — ED Triage Notes (Signed)
Pt c/o bilateral hand numbness. Pt states, "I think my blood is low". Pt reports hx of low blood counts with bilateral hand numbness. Denies weakness, dizziness.

## 2016-05-09 NOTE — ED Notes (Signed)
Pt reports she has had bilateral hand tingling and numbness since last week, improvement with ASA at home. States this happened before when her blood count was low.

## 2016-05-09 NOTE — ED Provider Notes (Signed)
[facility]-EMERGENCY DEPT Provider Note   CSN: [id number] Arrival date & time: 05/09/16  1344     History   Chief Complaint Chief Complaint  Patient presents with  . Numbness    bilateral hands    HPI Ashley Goodwin is a 83 y.o. female.  HPI Patient reports mild bilateral hand numbness and tingling over the past several days.  She states normally when her hands get like this she needs blood transfusion.  She has a history of blood transfusions every several years.  She does not have a clear understanding of why this occurs.  No weakness of her arms or legs.  No weakness of her hands just a numbness sensation.  No other complaints.  She is a very functional 83 year old who lives at home by herself and continues to mow her lawn weekly   Past Medical History:  Diagnosis Date  . Anemia   . Hypotension     There are no active problems to display for this patient.   Past Surgical History:  Procedure Laterality Date  . ABDOMINAL SURGERY    . esophagus burst      OB History    Gravida Para Term Preterm AB Living   0 0 0 0 0     SAB TAB Ectopic Multiple Live Births   0 0 0           Home Medications    Prior to Admission medications   Medication Sig Start Date End Date Taking? Authorizing Provider  acetaminophen (TYLENOL) 500 MG tablet Take 500 mg by mouth every 6 (six) hours as needed. For pain    Yes Historical Provider, MD  aspirin 325 MG tablet Take 325 mg by mouth every 4 (four) hours as needed.   Yes Historical Provider, MD  Cholecalciferol (VITAMIN D3) 5000 units CAPS Take 1 capsule by mouth 2 (two) times daily.   Yes Historical Provider, MD  VITAMIN B1-B12 IM Inject 1 mL into the muscle every 30 (thirty) days.     Yes Historical Provider, MD  cephALEXin (KEFLEX) 500 MG capsule Take 1 capsule (500 mg total) by mouth 3 (three) times daily. Patient not taking: Reported on 05/09/2016 07/11/15   Charlynne Panderavid Hsienta Yao, MD  hydrochlorothiazide (HYDRODIURIL) 12.5 MG tablet Take 1  tablet (12.5 mg total) by mouth daily. Patient not taking: Reported on 05/09/2016 01/11/14   Pricilla LovelessScott Goldston, MD  traMADol (ULTRAM) 50 MG tablet Take 1 tablet (50 mg total) by mouth every 6 (six) hours as needed. Patient not taking: Reported on 05/09/2016 07/11/15   Charlynne Panderavid Hsienta Yao, MD    Family History No family history on file.  Social History Social History  Substance Use Topics  . Smoking status: Former Games developermoker  . Smokeless tobacco: Never Used  . Alcohol use No     Allergies   Patient has no known allergies.   Review of Systems Review of Systems  All other systems reviewed and are negative.    Physical Exam Updated Vital Signs BP 185/98   Pulse 61   Temp 97.8 F (36.6 C) (Oral)   Resp 18   Ht 5\' 7"  (1.702 m)   Wt 140 lb (63.5 kg)   SpO2 100%   BMI 21.93 kg/m   Physical Exam  Constitutional: She is oriented to person, place, and time. She appears well-developed and well-nourished. No distress.  HENT:  Head: Normocephalic and atraumatic.  Eyes: EOM are normal.  Neck: Normal range of motion.  Cardiovascular: Normal rate,  regular rhythm and normal heart sounds.   Pulmonary/Chest: Effort normal and breath sounds normal.  Abdominal: Soft. She exhibits no distension. There is no tenderness.  Musculoskeletal: Normal range of motion.  Bilateral upper extremities are normal.  Full range of motion bilateral wrists, elbows, shoulders.  Normal bilateral radial pulses.  Normal grip strength bilaterally.  No skin changes or discoloration of her hands bilaterally  Neurological: She is alert and oriented to person, place, and time.  Skin: Skin is warm and dry.  Psychiatric: She has a normal mood and affect. Judgment normal.  Nursing note and vitals reviewed.    ED Treatments / Results  Labs (all labs ordered are listed, but only abnormal results are displayed) Labs Reviewed  CBC - Abnormal; Notable for the following:       Result Value   Hemoglobin 10.7 (*)    HCT 33.9  (*)    MCV 70.9 (*)    MCH 22.4 (*)    All other components within normal limits  BASIC METABOLIC PANEL - Abnormal; Notable for the following:    Sodium 133 (*)    BUN 22 (*)    GFR calc non Af Amer 47 (*)    GFR calc Af Amer 55 (*)    All other components within normal limits  VITAMIN B12  TYPE AND SCREEN    EKG  EKG Interpretation None       Radiology No results found.  Procedures Procedures (including critical care time)  Medications Ordered in ED Medications - No data to display   Initial Impression / Assessment and Plan / ED Course  I have reviewed the triage vital signs and the nursing notes.  Pertinent labs & imaging results that were available during my care of the patient were reviewed by me and considered in my medical decision making (see chart for details).     Hemoglobin 10.7.  No indication for blood transfusion.  Primary care follow-up.  No indication for additional workup in the ER.  Nonspecific bilateral hand numbness and tingling/paresthesias  Final Clinical Impressions(s) / ED Diagnoses   Final diagnoses:  Bilateral hand numbness    New Prescriptions New Prescriptions   No medications on file     Azalia Bilis, MD 05/09/16 [phone]

## 2016-12-21 ENCOUNTER — Other Ambulatory Visit: Payer: Self-pay

## 2016-12-21 ENCOUNTER — Inpatient Hospital Stay (HOSPITAL_COMMUNITY)
Admission: EM | Admit: 2016-12-21 | Discharge: 2016-12-25 | DRG: 281 | Disposition: A | Discharge status: Home / Self Care | Payer: Medicare HMO | Attending: Cardiology | Admitting: Cardiology

## 2016-12-21 ENCOUNTER — Encounter (HOSPITAL_COMMUNITY): Payer: Self-pay | Admitting: Emergency Medicine

## 2016-12-21 ENCOUNTER — Emergency Department (HOSPITAL_COMMUNITY): Payer: Medicare HMO

## 2016-12-21 DIAGNOSIS — I471 Supraventricular tachycardia, unspecified: Secondary | ICD-10-CM | POA: Clinically undetermined

## 2016-12-21 DIAGNOSIS — Z87891 Personal history of nicotine dependence: Secondary | ICD-10-CM | POA: Diagnosis not present

## 2016-12-21 DIAGNOSIS — E785 Hyperlipidemia, unspecified: Secondary | ICD-10-CM | POA: Diagnosis not present

## 2016-12-21 DIAGNOSIS — I2102 ST elevation (STEMI) myocardial infarction involving left anterior descending coronary artery: Secondary | ICD-10-CM | POA: Diagnosis not present

## 2016-12-21 DIAGNOSIS — D509 Iron deficiency anemia, unspecified: Secondary | ICD-10-CM | POA: Diagnosis present

## 2016-12-21 DIAGNOSIS — I119 Hypertensive heart disease without heart failure: Secondary | ICD-10-CM | POA: Diagnosis present

## 2016-12-21 DIAGNOSIS — Z79899 Other long term (current) drug therapy: Secondary | ICD-10-CM

## 2016-12-21 DIAGNOSIS — I071 Rheumatic tricuspid insufficiency: Secondary | ICD-10-CM | POA: Diagnosis not present

## 2016-12-21 DIAGNOSIS — I2121 ST elevation (STEMI) myocardial infarction involving left circumflex coronary artery: Secondary | ICD-10-CM | POA: Diagnosis not present

## 2016-12-21 DIAGNOSIS — R739 Hyperglycemia, unspecified: Secondary | ICD-10-CM | POA: Diagnosis not present

## 2016-12-21 DIAGNOSIS — Z8249 Family history of ischemic heart disease and other diseases of the circulatory system: Secondary | ICD-10-CM | POA: Diagnosis not present

## 2016-12-21 DIAGNOSIS — I361 Nonrheumatic tricuspid (valve) insufficiency: Secondary | ICD-10-CM | POA: Diagnosis not present

## 2016-12-21 DIAGNOSIS — I200 Unstable angina: Secondary | ICD-10-CM | POA: Diagnosis not present

## 2016-12-21 DIAGNOSIS — R079 Chest pain, unspecified: Secondary | ICD-10-CM | POA: Diagnosis not present

## 2016-12-21 DIAGNOSIS — E875 Hyperkalemia: Secondary | ICD-10-CM | POA: Diagnosis present

## 2016-12-21 DIAGNOSIS — I2119 ST elevation (STEMI) myocardial infarction involving other coronary artery of inferior wall: Secondary | ICD-10-CM | POA: Diagnosis not present

## 2016-12-21 LAB — CBC
HCT: 37.6 % (ref 36.0–46.0)
HEMOGLOBIN: 11.8 g/dL — AB (ref 12.0–15.0)
MCH: 22.4 pg — AB (ref 26.0–34.0)
MCHC: 31.4 g/dL (ref 30.0–36.0)
MCV: 71.3 fL — ABNORMAL LOW (ref 78.0–100.0)
Platelets: 187 10*3/uL (ref 150–400)
RBC: 5.27 MIL/uL — AB (ref 3.87–5.11)
RDW: 15.1 % (ref 11.5–15.5)
WBC: 7.2 10*3/uL (ref 4.0–10.5)

## 2016-12-21 LAB — BASIC METABOLIC PANEL
ANION GAP: 9 (ref 5–15)
BUN: 20 mg/dL (ref 6–20)
CHLORIDE: 103 mmol/L (ref 101–111)
CO2: 25 mmol/L (ref 22–32)
Calcium: 9.5 mg/dL (ref 8.9–10.3)
Creatinine, Ser: 1.18 mg/dL — ABNORMAL HIGH (ref 0.44–1.00)
GFR calc non Af Amer: 39 mL/min — ABNORMAL LOW (ref >60)
GFR, EST AFRICAN AMERICAN: 45 mL/min — AB (ref >60)
Glucose, Bld: 112 mg/dL — ABNORMAL HIGH (ref 65–99)
POTASSIUM: 5.6 mmol/L — AB (ref 3.5–5.1)
Sodium: 137 mmol/L (ref 135–145)

## 2016-12-21 LAB — POCT I-STAT TROPONIN I: Troponin i, poc: 1.69 ng/mL (ref 0.00–0.08)

## 2016-12-21 LAB — TROPONIN I
TROPONIN I: 3.54 ng/mL — AB (ref <0.03)
TROPONIN I: 12.69 ng/mL — AB (ref <0.03)

## 2016-12-21 LAB — MRSA PCR SCREENING: MRSA BY PCR: NEGATIVE

## 2016-12-21 MED ORDER — HEPARIN BOLUS VIA INFUSION: 4000.0000 [IU] | Freq: Once | INTRAVENOUS | Status: DC

## 2016-12-21 MED ORDER — ASPIRIN 81 MG PO CHEW
324.0000 mg | CHEWABLE_TABLET | Freq: Once | ORAL | Status: AC
  Administered 2016-12-21: 324 mg via ORAL
  Filled 2016-12-21: qty 4

## 2016-12-21 MED ORDER — ONDANSETRON HCL 4 MG/2ML IJ SOLN: 4.0000 mg | Freq: Four times a day (QID) | INTRAMUSCULAR | Status: DC | PRN

## 2016-12-21 MED ORDER — CLOPIDOGREL BISULFATE 75 MG PO TABS
300.0000 mg | ORAL_TABLET | Freq: Once | ORAL | Status: AC
  Administered 2016-12-21: 300 mg via ORAL
  Filled 2016-12-21: qty 4

## 2016-12-21 MED ORDER — ASPIRIN 300 MG RE SUPP: 300.0000 mg | RECTAL | Status: AC

## 2016-12-21 MED ORDER — ACETAMINOPHEN 325 MG PO TABS: 650.0000 mg | ORAL_TABLET | ORAL | Status: DC | PRN

## 2016-12-21 MED ORDER — METOPROLOL TARTRATE 25 MG PO TABS
25.0000 mg | ORAL_TABLET | Freq: Two times a day (BID) | ORAL | Status: DC
  Administered 2016-12-21 – 2016-12-25 (×8): 25 mg via ORAL
  Filled 2016-12-21 (×8): qty 1

## 2016-12-21 MED ORDER — SIMVASTATIN 40 MG PO TABS
40.0000 mg | ORAL_TABLET | Freq: Every day | ORAL | Status: DC
  Administered 2016-12-22 – 2016-12-25 (×4): 40 mg via ORAL
  Filled 2016-12-21 (×4): qty 1

## 2016-12-21 MED ORDER — NITROGLYCERIN 0.4 MG SL SUBL: 0.4000 mg | SUBLINGUAL_TABLET | SUBLINGUAL | Status: DC | PRN

## 2016-12-21 MED ORDER — ASPIRIN EC 81 MG PO TBEC
81.0000 mg | DELAYED_RELEASE_TABLET | Freq: Every day | ORAL | Status: DC
  Administered 2016-12-22 – 2016-12-25 (×4): 81 mg via ORAL
  Filled 2016-12-21 (×4): qty 1

## 2016-12-21 MED ORDER — HEPARIN SODIUM (PORCINE) 5000 UNIT/ML IJ SOLN
INTRAMUSCULAR | Status: AC
  Administered 2016-12-21: 4000 [IU]
  Filled 2016-12-21: qty 1

## 2016-12-21 MED ORDER — HEPARIN BOLUS VIA INFUSION
3000.0000 [IU] | Freq: Once | INTRAVENOUS | Status: AC
  Administered 2016-12-21: 3000 [IU] via INTRAVENOUS
  Filled 2016-12-21: qty 3000

## 2016-12-21 MED ORDER — HYDRALAZINE HCL 20 MG/ML IJ SOLN
10.0000 mg | Freq: Once | INTRAMUSCULAR | Status: AC
  Administered 2016-12-21: 10 mg via INTRAVENOUS
  Filled 2016-12-21: qty 1

## 2016-12-21 MED ORDER — HEPARIN (PORCINE) IN NACL 100-0.45 UNIT/ML-% IJ SOLN
1000.0000 [IU]/h | INTRAMUSCULAR | Status: AC
  Administered 2016-12-21 – 2016-12-23 (×2): 750 [IU]/h via INTRAVENOUS
  Filled 2016-12-21 (×2): qty 250

## 2016-12-21 MED ORDER — ASPIRIN 81 MG PO CHEW
324.0000 mg | CHEWABLE_TABLET | ORAL | Status: AC
  Filled 2016-12-21: qty 4

## 2016-12-21 MED ORDER — CLOPIDOGREL BISULFATE 75 MG PO TABS
75.0000 mg | ORAL_TABLET | Freq: Every day | ORAL | Status: DC
  Administered 2016-12-21 – 2016-12-25 (×3): 75 mg via ORAL
  Filled 2016-12-21 (×4): qty 1

## 2016-12-21 NOTE — Progress Notes (Signed)
ANTICOAGULATION CONSULT NOTE - Initial Consult  Pharmacy Consult for Heparin Indication: chest pain/ACS  No Known Allergies   Patient Measurements: Height: 5\' 7"  (170.2 cm) Weight: 133 lb 13.1 oz (60.7 kg) IBW/kg (Calculated) : 61.6 Heparin Dosing Weight: 60.7 kg  Vital Signs: Temp: 97.4 F (36.3 C) (10/19 1921) Temp Source: Oral (10/19 1921) BP: 182/98 (10/19 1921) Pulse Rate: 80 (10/19 1921)  Labs:  Recent Labs  12/21/16 1520  HGB 11.8*  HCT 37.6  PLT 187  CREATININE 1.18*  TROPONINI 3.54*    Estimated Creatinine Clearance: 28.5 mL/min (A) (by C-G formula based on SCr of 1.18 mg/dL (H)).   Medical History: Past Medical History:  Diagnosis Date  . Anemia   . Hypotension     Medications:   Assessment: 83 [facility] Infer[facility] STEMI to start IV heparin per pharmacy consult. Troponin now 3.54.   Goal of Therapy:  Heparin level 0.3-0.7 units/ml Monitor platelets by anticoagulation protocol: Yes   Plan:  Heparin bolus of 3000 units x1. Heparin drip at 750 units/hr.  Heparin level in 8 hours.  Daily heparin level and CBC.   Link SnufferJessica Cavion Faiola, PharmD, BCPS Clinical Pharmacist Clinical phone 12/21/2016 until [phone]- #[phone] After hours, please call #[phone] 12/21/2016,8:01 PM

## 2016-12-21 NOTE — ED Notes (Signed)
Date and time results received: 12/21/16 1607 (use smartphrase ".now" to insert current time)  Test: Troponin Critical Value: 3.54  Name of Provider Notified: Jeraldine LootsLockwood  Orders Received? Or Actions Taken?: Orders Received - See Orders for details

## 2016-12-21 NOTE — ED Notes (Signed)
Pt left with [facility]. Attempted to call report nurse unavailable. My number given and was told she would call me back

## 2016-12-21 NOTE — ED Notes (Signed)
Dr Jasmine DecemberLocwood aware of BP

## 2016-12-21 NOTE — ED Triage Notes (Signed)
Patient complaining of mid sternal chest pain off and on x 1 year starting again 2 days ago. States "it feels like gas but I'm taking Maalox but it's not helping."

## 2016-12-21 NOTE — ED Provider Notes (Signed)
[facility] EMERGENCY DEPARTMENT Provider Note   CSN: [id number] Arrival date & time: 12/21/16  1430     History   Chief Complaint Chief Complaint  Patient presents with  . Chest Pain    HPI Tommy MedalCarrie R Remlinger is a 83 y.o. female.  HPI  3:23 PM ECG c/w ischemia w lateral elevations and inferior changes as well.  Patient will be brought from triage  Patient brought to room expeditiously. She now states that she has had patient brought to room expeditiously. She now states that she has had episodes of mild inframammary pain going back for possibly 1 year.  Now, for the past 2 weeks, and possibly for a shorter time.  She has had nearly persistent, though still intermittent pain.  When pain is focally in the inframammary area, nonradiating, sore, tight.  Previously the pain has improved with Maalox, but over the past 2 days, she has had no improvement in spite of taking this medication. No new dyspnea, no new syncope, no fever, no chills. Patient is here with her daughter who assists with the HPI. Currently patient is chest pain-free.   Past Medical History:  Diagnosis Date  . Anemia   . Hypotension     There are no active problems to display for this patient.   Past Surgical History:  Procedure Laterality Date  . ABDOMINAL SURGERY    . esophagus burst      OB History    Gravida Para Term Preterm AB Living   0 0 0 0 0     SAB TAB Ectopic Multiple Live Births   0 0 0           Home Medications    Prior to Admission medications   Medication Sig Start Date End Date Taking? Authorizing Provider  acetaminophen (TYLENOL) 500 MG tablet Take 500 mg by mouth every 6 (six) hours as needed. For pain     [provider]  aspirin 325 MG tablet Take 325 mg by mouth every 4 (four) hours as needed.    [provider]  cephALEXin (KEFLEX) 500 MG capsule Take 1 capsule (500 mg total) by mouth 3 (three) times daily. Patient not taking: Reported on 05/09/2016  07/11/15   Charlynne PanderYao, David Hsienta, MD  Cholecalciferol (VITAMIN D3) 5000 units CAPS Take 1 capsule by mouth 2 (two) times daily.    [provider]  hydrochlorothiazide (HYDRODIURIL) 12.5 MG tablet Take 1 tablet (12.5 mg total) by mouth daily. Patient not taking: Reported on 05/09/2016 01/11/14   Pricilla LovelessGoldston, Scott, MD  traMADol (ULTRAM) 50 MG tablet Take 1 tablet (50 mg total) by mouth every 6 (six) hours as needed. Patient not taking: Reported on 05/09/2016 07/11/15   Charlynne PanderYao, David Hsienta, MD  VITAMIN B1-B12 IM Inject 1 mL into the muscle every 30 (thirty) days.      [provider]    Family History History reviewed. No pertinent family history.  Social History Social History  Substance Use Topics  . Smoking status: Former Games developermoker  . Smokeless tobacco: Never Used  . Alcohol use No     Allergies   Patient has no known allergies.   Review of Systems Review of Systems  Constitutional:       Per HPI, otherwise negative  HENT:       Per HPI, otherwise negative  Respiratory:       Per HPI, otherwise negative  Cardiovascular:       Per HPI, otherwise negative  Gastrointestinal:  Negative for vomiting.  Endocrine:       Negative aside from HPI  Genitourinary:       Neg aside from HPI   Musculoskeletal:       Per HPI, otherwise negative  Skin: Negative.   Neurological: Negative for syncope.     Physical Exam Updated Vital Signs There were no vitals taken for this visit.  Physical Exam  Constitutional: She is oriented to person, place, and time. She appears well-developed and well-nourished. No distress.  HENT:  Head: Normocephalic and atraumatic.  Eyes: Conjunctivae and EOM are normal.  Cardiovascular: Normal rate and regular rhythm.   Pulmonary/Chest: Effort normal and breath sounds normal. No stridor. No respiratory distress.  Abdominal: She exhibits no distension. There is no tenderness.  Musculoskeletal: She exhibits no edema.  Neurological: She is alert and  oriented to person, place, and time. No cranial nerve deficit.  Skin: Skin is warm and dry.  Psychiatric: She has a normal mood and affect.  Nursing note and vitals reviewed.    ED Treatments / Results  Labs (all labs ordered are listed, but only abnormal results are displayed) Labs Reviewed  CBC - Abnormal; Notable for the following:       Result Value   RBC 5.27 (*)    Hemoglobin 11.8 (*)    MCV 71.3 (*)    MCH 22.4 (*)    All other components within normal limits  POCT I-STAT TROPONIN I - Abnormal; Notable for the following:    Troponin i, poc 1.69 (*)    All other components within normal limits  BASIC METABOLIC PANEL  TROPONIN I  I-STAT TROPONIN, ED    EKG  EKG Interpretation  Date/Time:  Friday December 21 2016 15:19:28 EDT Ventricular Rate:  86 PR Interval:  134 QRS Duration: 80 QT Interval:  374 QTC Calculation: 447 R Axis:   84 Text Interpretation:   Critical Test Result: STEMI Normal sinus rhythm Cannot rule out Inferior infarct , age undetermined Lateral injury pattern ** ** ACUTE MI / STEMI ** ** Abnormal ECG Abnormal ekg Confirmed by Gerhard Munch [phone]) on 12/21/2016 3:23:03 PM        Procedures Procedures (including critical care time)  Medications Ordered in ED Medications  heparin bolus via infusion 4,000 Units ( Intravenous Canceled Entry 12/21/16 1550)  clopidogrel (PLAVIX) tablet 300 mg (not administered)  aspirin chewable tablet 324 mg (324 mg Oral Given 12/21/16 1548)  heparin 5000 UNIT/ML injection (4,000 Units  Given 12/21/16 1549)     Initial Impression / Assessment and Plan / ED Course  I have reviewed the triage vital signs and the nursing notes.  Pertinent labs & imaging results that were available during my care of the patient were reviewed by me and considered in my medical decision making (see chart for details).   Immediately after the initial evaluation I consulted our cardiology colleagues, given concern for ST elevation,  possible unstable angina.  Update: We discussed patient's presentation, and given concern for unstable angina, patient will start Plavix, heparin, aspirin. Daughter and patient aware of this.  Update: Initial troponin I 1.69  3:55 PM Patient accepted in transfer to our affiliated facility. On repeat exam the patient has no chest pain, is receiving aspirin, Plavix, heparin. I discussed findings with patient and her daughter again at length apparently also begin the discussion of risks and benefits of intervention versus medical management given thepatient accepted in transfer to our affiliated facility. On repeat exam the patient  has no chest pain, is receiving aspirin, Plavix, heparin. I discussed findings with patient and her daughter again at length. We also begin the discussion of risks and benefits of intervention versus medical management given the likely passage of time since the onset of her ischemia.  likely passage of time since the onset of her ischemia. With no current chest pain she is not designated as a code STEMI, but with concern for unstable angina, elevated troponinWith no current chest pain she is not designated as a code STEMI, but with concern for unstable angina, elevated troponin, she will be transferred expeditiously., she will be transferred expeditiously.  Final Clinical Impressions(s) / ED Diagnoses   Final diagnoses:  Unstable angina (HCC)   CRITICAL CARE Performed by: Gerhard Munch Total critical care time: 35 minutes Critical care time was exclusive of separately billable procedures and treating other patients. Critical care was necessary to treat or prevent imminent or life-threatening deterioration. Critical care was time spent personally by me on the following activities: development of treatment plan with patient and/or surrogate as well as nursing, discussions with consultants, evaluation of patient's response to treatment, examination of patient,  obtaining history from patient or surrogate, ordering and performing treatments and interventions, ordering and review of laboratory studies, ordering and review of radiographic studies, pulse oximetry and re-evaluation of patient's condition.    Gerhard Munch, MD 12/21/16 [phone]

## 2016-12-21 NOTE — H&P (Addendum)
Cardiology Admission History and Physical:   Patient ID: Ashley Goodwin; MRN: [id number]; DOB: [birth year]   Admission date: 12/21/2016  Primary Care Provider: Patient, No Pcp Per Primary Cardiologist: new to Dr. Anne FuSkains Primary Electrophysiologist:  N/A  Chief Complaint: Elevate   Patient Profile:   Ashley Goodwin is a 83 y.o. female with a past medical history of anemia. No prior cardiac history. [facility] as an NSTEMI.   History of Present Illness:   Ms. Ashley Goodwin is a 83 year old female with no past cardiac history who presented to [facility]16 with symptoms of GERD and upper epigastric pain. EKG showed inferolateral ST elevation. Initial troponin was 1.69. She says she has felt gas pains for the past 2 weeks, but they worsened over the past 2 days. Today, she had pain under her left breast with no associated SOB or diaphoresis. Of note, she does say about 2 years ago, she underwent GI study for GI bleeding. She had a transfusion and underwent what sounds like EGD, but says that nothing was found.   She does not have a PCP, takes no medications other than OTC Gas X. She remains active, she mowed her lawn 4 days ago. 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   Currently denies chest pain, epigastric pain. She did receive a heparin bolus [facility], but currently not on a heparin gtt. Denies orthopnea, PND, syncope, palpitations.    Past Medical History:  Diagnosis Date  . Anemia   . Hypotension     Past Surgical History:  Procedure Laterality Date  . ABDOMINAL SURGERY    . esophagus burst       Medications Prior to Admission: Prior to Admission medications   Medication Sig Start Date End Date Taking? Authorizing Provider  acetaminophen (TYLENOL) 500 MG tablet Take 500 mg by mouth every 6 (six) hours as needed. For pain     [provider]  alendronate-cholecalciferol (FOSAMAX PLUS D) 70-2800 MG-UNIT  tablet Take 1 tablet by mouth every 7 (seven) days.     [provider]  aspirin 325 MG tablet Take 325 mg by mouth every 4 (four) hours as needed.    [provider]  Cholecalciferol (VITAMIN D3) 5000 units CAPS Take 1 capsule by mouth 2 (two) times daily.    [provider]  VITAMIN B1-B12 IM Inject 1 mL into the muscle every 30 (thirty) days.      [provider]     Allergies:   No Known Allergies  Social History:   Social History   Social History  . Marital status: Married    Spouse name: N/A  . Number of children: N/A  . Years of education: N/A   Occupational History  . Not on file.   Social History Main Topics  . Smoking status: Former Games developermoker  . Smokeless tobacco: Never Used  . Alcohol use No  . Drug use: No  . Sexual activity: Not on file   Other Topics Concern  . Not on file   Social History Narrative   ** Merged History Encounter **        Family History:   The patient's family history includes Hypertension in her maternal grandmother.    ROS:  Please see the history of present illness.  All other ROS reviewed and negative.     Physical Exam/Data:   Vitals:   12/21/16 1649 12/21/16 1706 12/21/16 1803  12/21/16 1830  BP: (!) 200/16 (!) 175/92 (!) 164/105 (!) 177/107  Pulse: 73 82 93 80  Resp: (!) 27 (!) 26 (!) 21 (!) 26  Temp:  98.2 F (36.8 C) (!) 97.5 F (36.4 C)   TempSrc:  Oral Oral   SpO2: 100% 100% 100% 100%  Weight:   133 lb 13.1 oz (60.7 kg)   Height:   5\' 7"  (1.702 m)    No intake or output data in the 24 hours ending 12/21/16 1901 Filed Weights   12/21/16 1524 12/21/16 1803  Weight: 160 lb (72.6 kg) 133 lb 13.1 oz (60.7 kg)   Body mass index is 20.96 kg/m.  General:  Well nourished, well developed, in no acute distress HEENT: normal Lymph: no adenopathy Neck: no JVD Endocrine:  No thryomegaly Vascular: No carotid bruits; FA pulses 2+ bilaterally without bruits  Cardiac:  normal S1, S2; RRR; no  murmur  Lungs:  clear to auscultation bilaterally, no wheezing, rhonchi or rales  Abd: soft, nontender, no hepatomegaly  Ext: no edema Musculoskeletal:  No deformities, BUE and BLE strength normal and equal Skin: warm and dry  Neuro:  CNs 2-12 intact, no focal abnormalities noted Psych:  Normal affect    EKG:  The ECG that was done 10/19/18was personally reviewed and demonstrates NSR, ST elevation in inferolateral leads.   Relevant CV Studies:   Laboratory Data:  Chemistry  Recent Labs Lab 12/21/16 1520  NA 137  K 5.6*  CL 103  CO2 25  GLUCOSE 112*  BUN 20  CREATININE 1.18*  CALCIUM 9.5  GFRNONAA 39*  GFRAA 45*  ANIONGAP 9    No results for input(s): PROT, ALBUMIN, AST, ALT, ALKPHOS, BILITOT in the last 168 hours. Hematology  Recent Labs Lab 12/21/16 1520  WBC 7.2  RBC 5.27*  HGB 11.8*  HCT 37.6  MCV 71.3*  MCH 22.4*  MCHC 31.4  RDW 15.1  PLT 187   Cardiac Enzymes  Recent Labs Lab 12/21/16 1520  TROPONINI 3.54*     Recent Labs Lab 12/21/16 1532  TROPIPOC 1.69*    BNPNo results for input(s): BNP, PROBNP in the last 168 hours.  DDimer No results for input(s): DDIMER in the last 168 hours.  Radiology/Studies:  Dg Chest Port 1 View  Result Date: 12/21/2016 CLINICAL DATA:  83 year old female with chest pain EXAM: PORTABLE CHEST 1 VIEW COMPARISON:  Prior chest x-ray 01/11/2014 FINDINGS: Stable cardiac and mediastinal contours. Atherosclerotic calcifications again noted throughout the transverse aorta. Inspiratory volumes are low. Slightly increased bibasilar atelectasis. No pleural effusion, pneumothorax or focal airspace consolidation. Stable background bronchitic changes and emphysema. No acute osseous abnormality. IMPRESSION: 1. Slightly low inspiratory volumes and bibasilar atelectasis. Otherwise, no acute cardiopulmonary process. 2. Aortic Atherosclerosis (ICD10-170.0) 3. Chronic bronchitic changes and emphysema. Electronically Signed   By: Malachy Moan M.D.   On: 12/21/2016 16:18    Assessment and Plan:   1. Inferolateral STEMI: Patient presented with atypical symptoms, but with ST changes and troponin now 3.54. Patient is comfortable, hemodynamically stable. Will not pursue cath at this point. Will continue to cycle troponin, get an Echo. Will also start heparin gtt for the next 48 hours.  - Start metoprolol 25 mg BID for elevated BP and in the setting of STEMI.  - can start Nitro gtt if she develops recurrent GI pain which is her anginal equivalent.  - Will start on high intensity statin.  - Will also add Plavix. Will watch CBC with history of  anemia.   2. HTN - As above, starting metoprolol. Will likely need to add another agent.      Severity of Illness: The appropriate patient status for this patient is INPATIENT. Inpatient status is judged to be reasonable and necessary in order to provide the required intensity of service to ensure the patient's safety. The patient's presenting symptoms, physical exam findings, and initial radiographic and laboratory data in the context of their chronic comorbidities is felt to place them at high risk for further clinical deterioration. Furthermore, it is not anticipated that the patient will be medically stable for discharge from the hospital within 2 midnights of admission. The following factors support the patient status of inpatient.   " The patient's presenting symptoms include epigastric pain. " The worrisome physical exam findings include none.  " The initial radiographic and laboratory data are worrisome because of elevated troponin  " The chronic co-morbidities include none.    * I certify that at the point of admission it is my clinical judgment that the patient will require inpatient hospital care spanning beyond 2 midnights from the point of admission due to high intensity of service, high risk for further deterioration and high frequency of surveillance required.*    For  questions or updates, please contact [facility] Please consult [email or web address] for contact info under Cardiology/STEMI.    Signed, Little Ishikawa, NP  12/21/2016 7:01 PM   Personally seen and examined. Agree with above.  83 year old female who 4 days ago was mowing her lawn, presented to [facility] emergency department with symptoms that she thought was related to increased GERD under her left breast, moderate in intensity.  She states that she has been having these types of symptoms on and off for over 2 years and takes Mylanta.  After she had a bowel movement, her pain improved.  While in the emergency department, an EKG was performed that demonstrated inferior lateral ST elevation with biphasic T waves consistent with acute injury/ischemia.  Troponin was 1.69.  Personally reviewed.  Currently she is very comfortable in bed, no shortness of breath, no syncope, no fevers, no vomiting.  She is very active.  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  Exam: Alert and oriented, elderly, conversant, regular rate and rhythm no significant murmurs, lungs are clear, abdomen soft with prior scar from gastric surgery.  Assessment and plan:  83 year old female with inferior lateral ST elevation myocardial infarction  -Given the onset of symptoms and current lack of symptoms, we will continue to treat her ST elevation myocardial infarction medically, noninvasively.  Starting IV heparin, aspirin, Plavix 75 mg, atorvastatin 40 mg, nitroglycerin as needed.  Check an echocardiogram.  If her symptoms deteriorate one could consider invasive approach but for now given the inferior nature and lack of symptoms/advanced age, risks could possibly outweigh benefits.  -We will continue to cycle troponins.  -Continue to treat hypertension.  -She states that she has had prior history of blood transfusion in the past.  Her MCV is slightly low.  Continue to monitor her CBC closely and watch for  any signs of GI bleeding.  Donato Schultz, MD

## 2016-12-22 ENCOUNTER — Inpatient Hospital Stay (HOSPITAL_COMMUNITY): Payer: Medicare HMO

## 2016-12-22 DIAGNOSIS — I2121 ST elevation (STEMI) myocardial infarction involving left circumflex coronary artery: Secondary | ICD-10-CM

## 2016-12-22 DIAGNOSIS — I361 Nonrheumatic tricuspid (valve) insufficiency: Secondary | ICD-10-CM

## 2016-12-22 LAB — BASIC METABOLIC PANEL
ANION GAP: 13 (ref 5–15)
ANION GAP: 11 (ref 5–15)
BUN: 13 mg/dL (ref 6–20)
BUN: 14 mg/dL (ref 6–20)
CALCIUM: 9.3 mg/dL (ref 8.9–10.3)
CALCIUM: 9.4 mg/dL (ref 8.9–10.3)
CO2: 19 mmol/L — AB (ref 22–32)
CO2: 22 mmol/L (ref 22–32)
Chloride: 103 mmol/L (ref 101–111)
Chloride: 101 mmol/L (ref 101–111)
Creatinine, Ser: 1.03 mg/dL — ABNORMAL HIGH (ref 0.44–1.00)
Creatinine, Ser: 1.09 mg/dL — ABNORMAL HIGH (ref 0.44–1.00)
GFR calc Af Amer: 49 mL/min — ABNORMAL LOW (ref >60)
GFR calc non Af Amer: 42 mL/min — ABNORMAL LOW (ref >60)
GFR, EST AFRICAN AMERICAN: 53 mL/min — AB (ref >60)
GFR, EST NON AFRICAN AMERICAN: 45 mL/min — AB (ref >60)
GLUCOSE: 107 mg/dL — AB (ref 65–99)
Glucose, Bld: 112 mg/dL — ABNORMAL HIGH (ref 65–99)
Potassium: 4.6 mmol/L (ref 3.5–5.1)
Potassium: 4.7 mmol/L (ref 3.5–5.1)
SODIUM: 135 mmol/L (ref 135–145)
Sodium: 134 mmol/L — ABNORMAL LOW (ref 135–145)

## 2016-12-22 LAB — CBC
HEMATOCRIT: 38.7 % (ref 36.0–46.0)
Hemoglobin: 12.2 g/dL (ref 12.0–15.0)
MCH: 21.7 pg — ABNORMAL LOW (ref 26.0–34.0)
MCHC: 31.5 g/dL (ref 30.0–36.0)
MCV: 69 fL — ABNORMAL LOW (ref 78.0–100.0)
PLATELETS: 170 10*3/uL (ref 150–400)
RBC: 5.61 MIL/uL — ABNORMAL HIGH (ref 3.87–5.11)
RDW: 15 % (ref 11.5–15.5)
WBC: 7.8 10*3/uL (ref 4.0–10.5)

## 2016-12-22 LAB — ECHOCARDIOGRAM COMPLETE
Height: 67 in
WEIGHTICAEL: 2141.11 [oz_av]

## 2016-12-22 LAB — LIPID PANEL
Cholesterol: 236 mg/dL — ABNORMAL HIGH (ref 0–200)
HDL: 52 mg/dL (ref >40)
LDL CALC: 163 mg/dL — AB (ref 0–99)
Total CHOL/HDL Ratio: 4.5 RATIO
Triglycerides: 104 mg/dL (ref <150)
VLDL: 21 mg/dL (ref 0–40)

## 2016-12-22 LAB — HEPARIN LEVEL (UNFRACTIONATED)
HEPARIN UNFRACTIONATED: 0.42 [IU]/mL (ref 0.30–0.70)
Heparin Unfractionated: 0.33 IU/mL (ref 0.30–0.70)

## 2016-12-22 LAB — TROPONIN I
TROPONIN I: 19.54 ng/mL — AB (ref <0.03)
Troponin I: 31.26 ng/mL (ref <0.03)

## 2016-12-22 MED ORDER — SODIUM CHLORIDE 0.9 % IV SOLN
INTRAVENOUS | Status: DC
  Administered 2016-12-22 – 2016-12-24 (×2): via INTRAVENOUS

## 2016-12-22 MED ORDER — NITROGLYCERIN IN D5W 200-5 MCG/ML-% IV SOLN
INTRAVENOUS | Status: AC
  Administered 2016-12-22: 10 ug/min
  Filled 2016-12-22: qty 250

## 2016-12-22 MED ORDER — NITROGLYCERIN IN D5W 200-5 MCG/ML-% IV SOLN
0.0000 ug/min | INTRAVENOUS | Status: DC
  Administered 2016-12-22: 10 ug/min via INTRAVENOUS
  Administered 2016-12-24: 5 ug/min via INTRAVENOUS
  Filled 2016-12-22: qty 250

## 2016-12-22 NOTE — Progress Notes (Signed)
ANTICOAGULATION CONSULT NOTE  Pharmacy Consult for Heparin Indication: chest pain/ACS  No Known Allergies   Patient Measurements: Height: 5\' 7"  (170.2 cm) Weight: 133 lb 13.1 oz (60.7 kg) IBW/kg (Calculated) : 61.6 Heparin Dosing Weight: 60.7 kg  Vital Signs: Temp: 97.6 F (36.4 C) (10/20 1205) Temp Source: Oral (10/20 1205) BP: 152/83 (10/20 1300) Pulse Rate: 66 (10/20 1300)  Labs:  Recent Labs  12/21/16 1520 12/21/16 2127 12/22/16 0559 12/22/16 0930 12/22/16 1217  HGB 11.8*  --   --   --  12.2  HCT 37.6  --   --   --  38.7  PLT 187  --   --   --  170  HEPARINUNFRC  --   --  0.42  --  0.33  CREATININE 1.18*  --   --  1.03*  --   TROPONINI 3.54* 12.69* 19.54*  --   --     Estimated Creatinine Clearance: 32.7 mL/min (A) (by C-G formula based on SCr of 1.03 mg/dL (H)).   Medical History: Past Medical History:  Diagnosis Date  . Anemia   . Hypotension     Assessment: 83 year old female [facility] with Inferolateral STEMI to start IV heparin per pharmacy consult. Per notes, plan to treat STEMI medically. Troponin continues to trend upward from 3.54 to 12.69.  Heparin level therapeutic x 2   Goal of Therapy:  Heparin level 0.3-0.7 units/ml Monitor platelets by anticoagulation protocol: Yes   Plan:  Continue heparin drip at 750 units/hr.   Daily heparin level and CBC.  Monitor for signs/symptoms of bleeding.  Thank you Okey RegalLisa Kairen Hallinan, PharmD [phone] 12/22/2016,2:03 PM

## 2016-12-22 NOTE — Progress Notes (Addendum)
Progress Note  Patient Name: Ashley Goodwin Date of Encounter: 12/22/2016  Primary Cardiologist: MS     Patient Profile      Ashley Goodwin is a 83 y.o. female with a past medical history of anemia. No prior cardiac history. Transferred from [facility] as an inferolateral STEMI. Elected to treat medically  UFH x 48 hr DAPT and statins   Subjective   Gas sensation retrosternal is presently gone, but has been intermittent   Inpatient Medications    Scheduled Meds: . aspirin  324 mg Oral NOW   Or  . aspirin  300 mg Rectal NOW  . aspirin EC  81 mg Oral Daily  . clopidogrel  75 mg Oral Daily  . metoprolol tartrate  25 mg Oral BID  . simvastatin  40 mg Oral q1800   Continuous Infusions: . heparin 750 Units/hr (12/22/16 0700)   PRN Meds: acetaminophen, nitroGLYCERIN, ondansetron (ZOFRAN) IV   Vital Signs    Vitals:   12/22/16 0700 12/22/16 0715 12/22/16 0730 12/22/16 0733  BP: (!) 166/88  (!) 167/93   Pulse: 75 80 68   Resp: (!) 23 17 (!) 30   Temp:    (!) 97.5 F (36.4 C)  TempSrc:    Oral  SpO2: 100% 100% 100%   Weight:      Height:        Intake/Output Summary (Last 24 hours) at 12/22/16 0846 Last data filed at 12/22/16 0700  Gross per 24 hour  Intake              136 ml  Output             1951 ml  Net            -1815 ml   Filed Weights   12/21/16 1524 12/21/16 1803  Weight: 160 lb (72.6 kg) 133 lb 13.1 oz (60.7 kg)    Telemetry    Sinus w occ PVC - Personally Reviewed  ECG    nsr with persistnet mild STE infero lateally  New changes in V4 but not sure if lead placment - Personally Reviewed  Physical Exam    GEN: No acute distress.   Neck: JVD flat Cardiac: RRR, no  murmurs, rubs, or gallops.  Respiratory: Clear to auscultation bilaterally. GI: Soft, nontender, non-distended  MS:  edema; No deformity. Neuro:  Nonfocal  Psych: Normal affect  Skin Warm and dry   Labs    Chemistry Recent Labs Lab 12/21/16 1520  NA 137  K  5.6*  CL 103  CO2 25  GLUCOSE 112*  BUN 20  CREATININE 1.18*  CALCIUM 9.5  GFRNONAA 39*  GFRAA 45*  ANIONGAP 9     Hematology Recent Labs Lab 12/21/16 1520  WBC 7.2  RBC 5.27*  HGB 11.8*  HCT 37.6  MCV 71.3*  MCH 22.4*  MCHC 31.4  RDW 15.1  PLT 187    Cardiac Enzymes Recent Labs Lab 12/21/16 1520 12/21/16 2127 12/22/16 0559  TROPONINI 3.54* 12.69* 19.54*    Recent Labs Lab 12/21/16 1532  TROPIPOC 1.69*     BNPNo results for input(s): BNP, PROBNP in the last 168 hours.   DDimer No results for input(s): DDIMER in the last 168 hours.   Radiology    Dg Chest Port 1 View  Result Date: 12/21/2016 CLINICAL DATA:  83 year old female with chest pain EXAM: PORTABLE CHEST 1 VIEW COMPARISON:  Prior chest x-ray 01/11/2014 FINDINGS: Stable cardiac and mediastinal contours.  Atherosclerotic calcifications again noted throughout the transverse aorta. Inspiratory volumes are low. Slightly increased bibasilar atelectasis. No pleural effusion, pneumothorax or focal airspace consolidation. Stable background bronchitic changes and emphysema. No acute osseous abnormality. IMPRESSION: 1. Slightly low inspiratory volumes and bibasilar atelectasis. Otherwise, no acute cardiopulmonary process. 2. Aortic Atherosclerosis (ICD10-170.0) 3. Chronic bronchitic changes and emphysema. Electronically Signed   By: Malachy MoanHeath  McCullough M.D.   On: 12/21/2016 16:18    Cardiac Studies   Echo P       Assessment & Plan  STEMI--conservative management Troponin still climbing   Hypertensive heart disease  Hyperkalemia   Microcytic anemia      wil recheck K prior to intervening  Symptoms are intermittent,  The troponin elevation w/o reperfusion would expect to peak aobut 48 hrs  Continue hep, BB and DAPT Will add NTG for BP control     Signed, Sherryl MangesSteven Klein, MD  12/22/2016, 8:46 AM

## 2016-12-22 NOTE — Progress Notes (Signed)
  The patient's troponins continued to increase. I have reviewed the physiology with the patient and her daughters. The patient is quite adamant that she does not want any interventional procedures. She has been without chest discomfort now for more than 8 hours. I suspect that she has completed her infarct and her troponin apogee is approaching  We will continue her on medical therapy.

## 2016-12-22 NOTE — Progress Notes (Signed)
CRITICAL VALUE ALERT  Critical Value:  Troponin 31.26  Date & Time Notied:  12/22/16 at 1445  Provider Notified: Dr. Graciela HusbandsKlein  Orders Received/Actions taken: No new orders given will consult Dr. SwazilandJordan.

## 2016-12-22 NOTE — Progress Notes (Signed)
ANTICOAGULATION CONSULT NOTE - Initial Consult  Pharmacy Consult for Heparin Indication: chest pain/ACS  No Known Allergies   Patient Measurements: Height: 5\' 7"  (170.2 cm) Weight: 133 lb 13.1 oz (60.7 kg) IBW/kg (Calculated) : 61.6 Heparin Dosing Weight: 60.7 kg  Vital Signs: Temp: 97.5 F (36.4 C) (10/20 0733) Temp Source: Oral (10/20 0733) BP: 167/93 (10/20 0730) Pulse Rate: 68 (10/20 0730)  Labs:  Recent Labs  12/21/16 1520 12/21/16 2127 12/22/16 0559  HGB 11.8*  --   --   HCT 37.6  --   --   PLT 187  --   --   HEPARINUNFRC  --   --  0.42  CREATININE 1.18*  --   --   TROPONINI 3.54* 12.69*  --     Estimated Creatinine Clearance: 28.5 mL/min (A) (by C-G formula based on SCr of 1.18 mg/dL (H)).   Medical History: Past Medical History:  Diagnosis Date  . Anemia   . Hypotension     Assessment: 83 [facility] old [facility] with Inferolateral STEMI to start IV heparin per pharmacy consult. Per notes, plan to treat STEMI medically. Troponin continues to trend upward from 3.54 to 12.69.  Heparin level came back at 0.42, within goal range, on 750 units/hr. CBC was within normal limits at admission. No infusion issues noted by nursing. No signs/symptoms of bleeding reported.     Goal of Therapy:  Heparin level 0.3-0.7 units/ml Monitor platelets by anticoagulation protocol: Yes   Plan:  Continue heparin drip at 750 units/hr.  Confirmatory heparin level in 8 hours.  Daily heparin level and CBC.  Monitor for signs/symptoms of bleeding.  Girard CooterKimberly Perkins, PharmD Clinical Pharmacist  Phone: [phone] 12/22/2016,7:50 AM

## 2016-12-22 NOTE — Progress Notes (Signed)
  Echocardiogram 2D Echocardiogram has been performed.  Mahalia Dykes T Eli Adami 12/22/2016, 9:22 AM

## 2016-12-23 ENCOUNTER — Other Ambulatory Visit: Payer: Self-pay

## 2016-12-23 DIAGNOSIS — I2102 ST elevation (STEMI) myocardial infarction involving left anterior descending coronary artery: Secondary | ICD-10-CM

## 2016-12-23 LAB — CBC
HCT: 35.4 % — ABNORMAL LOW (ref 36.0–46.0)
HEMOGLOBIN: 11.4 g/dL — AB (ref 12.0–15.0)
MCH: 22.2 pg — AB (ref 26.0–34.0)
MCHC: 32.2 g/dL (ref 30.0–36.0)
MCV: 68.9 fL — ABNORMAL LOW (ref 78.0–100.0)
PLATELETS: 173 10*3/uL (ref 150–400)
RBC: 5.14 MIL/uL — AB (ref 3.87–5.11)
RDW: 14.8 % (ref 11.5–15.5)
WBC: 13.6 10*3/uL — ABNORMAL HIGH (ref 4.0–10.5)

## 2016-12-23 LAB — HEPARIN LEVEL (UNFRACTIONATED)
HEPARIN UNFRACTIONATED: 0.19 [IU]/mL — AB (ref 0.30–0.70)
Heparin Unfractionated: 0.21 IU/mL — ABNORMAL LOW (ref 0.30–0.70)

## 2016-12-23 LAB — TROPONIN I: TROPONIN I: 25.84 ng/mL — AB (ref <0.03)

## 2016-12-23 MED ORDER — HEPARIN BOLUS VIA INFUSION
1000.0000 [IU] | Freq: Once | INTRAVENOUS | Status: AC
Start: 2016-12-23 — End: 2016-12-23
  Administered 2016-12-23: 1000 [IU] via INTRAVENOUS
  Filled 2016-12-23: qty 1000

## 2016-12-23 NOTE — Progress Notes (Signed)
Progress Note  Patient Name: Ashley Goodwin Date of Encounter: 12/23/2016  Primary Cardiologist: MS     Patient Profile      Ashley Goodwin is a 83 y.o. female with a past medical history of anemia. No prior cardiac history. Transferred from [facility] as an inferolateral STEMI. Elected to treat medically  UFH x 48 hr DAPT and statins   Subjective   Without complaints   No cp sob   Inpatient Medications    Scheduled Meds: . aspirin EC  81 mg Oral Daily  . clopidogrel  75 mg Oral Daily  . metoprolol tartrate  25 mg Oral BID  . simvastatin  40 mg Oral q1800   Continuous Infusions: . sodium chloride 10 mL/hr at 12/23/16 0800  . heparin 900 Units/hr (12/23/16 0800)  . nitroGLYCERIN 10 mcg/min (12/23/16 0800)   PRN Meds: acetaminophen, nitroGLYCERIN, ondansetron (ZOFRAN) IV   Vital Signs    Vitals:   12/23/16 0630 12/23/16 0700 12/23/16 0720 12/23/16 0800  BP: 137/78 138/77  (!) 147/78  Pulse: 70 73  70  Resp: (!) 23 (!) 23  (!) 23  Temp:   98 F (36.7 C)   TempSrc:   Oral   SpO2: 100% 100%  100%  Weight:      Height:        Intake/Output Summary (Last 24 hours) at 12/23/16 0833 Last data filed at 12/23/16 0800  Gross per 24 hour  Intake           701.47 ml  Output              700 ml  Net             1.47 ml   Filed Weights   12/21/16 1524 12/21/16 1803  Weight: 160 lb (72.6 kg) 133 lb 13.1 oz (60.7 kg)    Telemetry    NSR without - Personally Reviewed  ECG    nsr with resolved ST elevation but persisting  Inferolateral T Wave inversions now V3-V4   Personally Reviewed  Physical Exam    Well developed and nourished in no acute distress HENT normal Neck supple with JVP-flat Carotids brisk and full without bruits Clear Regular rate and rhythm, no murmurs or gallops Abd-soft with active BS without hepatomegaly No Clubbing cyanosis edema Skin-warm and dry A & Oriented  Grossly normal sensory and motor function   Labs      Chemistry  Recent Labs Lab 12/21/16 1520 12/22/16 0930 12/22/16 1217  NA 137 135 134*  K 5.6* 4.6 4.7  CL 103 103 101  CO2 25 19* 22  GLUCOSE 112* 112* 107*  BUN 20 13 14   CREATININE 1.18* 1.03* 1.09*  CALCIUM 9.5 9.3 9.4  GFRNONAA 39* 45* 42*  GFRAA 45* 53* 49*  ANIONGAP 9 13 11      Hematology  Recent Labs Lab 12/21/16 1520 12/22/16 1217 12/23/16 0406  WBC 7.2 7.8 13.6*  RBC 5.27* 5.61* 5.14*  HGB 11.8* 12.2 11.4*  HCT 37.6 38.7 35.4*  MCV 71.3* 69.0* 68.9*  MCH 22.4* 21.7* 22.2*  MCHC 31.4 31.5 32.2  RDW 15.1 15.0 14.8  PLT 187 170 173    Cardiac Enzymes  Recent Labs Lab 12/21/16 1520 12/21/16 2127 12/22/16 0559 12/22/16 1217  TROPONINI 3.54* 12.69* 19.54* 31.26*     Recent Labs Lab 12/21/16 1532  TROPIPOC 1.69*     BNPNo results for input(s): BNP, PROBNP in the last 168 hours.   DDimer  No results for input(s): DDIMER in the last 168 hours.   Radiology    Dg Chest Port 1 View  Result Date: 12/21/2016 CLINICAL DATA:  83 year old female with chest pain EXAM: PORTABLE CHEST 1 VIEW COMPARISON:  Prior chest x-ray 01/11/2014 FINDINGS: Stable cardiac and mediastinal contours. Atherosclerotic calcifications again noted throughout the transverse aorta. Inspiratory volumes are low. Slightly increased bibasilar atelectasis. No pleural effusion, pneumothorax or focal airspace consolidation. Stable background bronchitic changes and emphysema. No acute osseous abnormality. IMPRESSION: 1. Slightly low inspiratory volumes and bibasilar atelectasis. Otherwise, no acute cardiopulmonary process. 2. Aortic Atherosclerosis (ICD10-170.0) 3. Chronic bronchitic changes and emphysema. Electronically Signed   By: Malachy MoanHeath  McCullough M.D.   On: 12/21/2016 16:18    Cardiac Studies   Echo EF 50-55%; mild LAE      Assessment & Plan  STEMI--conservative management    Hypertensive heart disease  Microcytic anemia  Will discontinue Heparin in am  after 48  hrs  Continue  BB and DAPT, NTG   mobilizie and hopefully home 48 hrs  Repeat troponin to assure apogee   Signed, Sherryl MangesSteven Klein, MD  12/23/2016, 8:33 AM

## 2016-12-23 NOTE — Progress Notes (Signed)
ANTICOAGULATION CONSULT NOTE  Pharmacy Consult for Heparin Indication: chest pain/ACS  No Known Allergies   Patient Measurements: Height: 5\' 7"  (170.2 cm) Weight: 133 lb 13.1 oz (60.7 kg) IBW/kg (Calculated) : 61.6 Heparin Dosing Weight: 60.7 kg  Vital Signs: Temp: 97.6 F (36.4 C) (10/21 1200) Temp Source: Oral (10/21 1200) BP: 137/75 (10/21 1200) Pulse Rate: 69 (10/21 1200)  Labs:  Recent Labs  12/21/16 1520  12/22/16 0559 12/22/16 0930 12/22/16 1217 12/23/16 0406 12/23/16 0911 12/23/16 1242  HGB 11.8*  --   --   --  12.2 11.4*  --   --   HCT 37.6  --   --   --  38.7 35.4*  --   --   PLT 187  --   --   --  170 173  --   --   HEPARINUNFRC  --   < > 0.42  --  0.33 0.19*  --  0.21*  CREATININE 1.18*  --   --  1.03* 1.09*  --   --   --   TROPONINI 3.54*  < > 19.54*  --  31.26*  --  25.84*  --   < > = values in this interval not displayed.  Estimated Creatinine Clearance: 30.9 mL/min (A) (by C-G formula based on SCr of 1.09 mg/dL (H)).   Medical History: Past Medical History:  Diagnosis Date  . Anemia   . Hypotension     Assessment: 83 [facility] Infer[facility] STEMI to start IV heparin per pharmacy consult. Per notes, plan to treat STEMI medically- plan heparin for 48 hours (end date is 10/21 at 2029). Troponin continues to trend upward from 3.54>31.26>25.84.  Heparin level came back low this morning and rate was increased. Repeat level this afternoon came back at 0.21 on 900 units/hr. CBC decreased and platelets within normal limits. No signs/symptoms of bleeding.   Goal of Therapy:  Heparin level 0.3-0.7 units/ml Monitor platelets by anticoagulation protocol: Yes   Plan:  Continue heparin drip at 900 units/hr until stop time.   Monitor for signs/symptoms of bleeding.  Girard CooterKimberly Perkins, PharmD Clinical Pharmacist  Phone: [phone] 12/23/2016,2:31 PM

## 2016-12-23 NOTE — Progress Notes (Signed)
ANTICOAGULATION CONSULT NOTE - Follow Up Consult  Pharmacy Consult for heparin Indication: STEMI  Labs:  Recent Labs  12/21/16 1520 12/21/16 2127 12/22/16 0559 12/22/16 0930 12/22/16 1217 12/23/16 0406  HGB 11.8*  --   --   --  12.2 11.4*  HCT 37.6  --   --   --  38.7 35.4*  PLT 187  --   --   --  170 PENDING  HEPARINUNFRC  --   --  0.42  --  0.33 0.19*  CREATININE 1.18*  --   --  1.03* 1.09*  --   TROPONINI 3.54* 12.69* 19.54*  --  31.26*  --     Assessment: 83yo female now below goal on heparin after two levels at goal though had been trending down.  Goal of Therapy:  Heparin level 0.3-0.7 units/ml   Plan:  Will give small bolus of heparin 1000 units and increase gtt by 2-3 units/kg/hr to 900 units/hr and check level in 8hr.  Vernard GamblesVeronda Madolyn Ackroyd, PharmD, BCPS  12/23/2016,5:21 AM

## 2016-12-24 DIAGNOSIS — D509 Iron deficiency anemia, unspecified: Secondary | ICD-10-CM

## 2016-12-24 DIAGNOSIS — E785 Hyperlipidemia, unspecified: Secondary | ICD-10-CM | POA: Diagnosis present

## 2016-12-24 DIAGNOSIS — R739 Hyperglycemia, unspecified: Secondary | ICD-10-CM

## 2016-12-24 LAB — HEPARIN LEVEL (UNFRACTIONATED): HEPARIN UNFRACTIONATED: 0.14 [IU]/mL — AB (ref 0.30–0.70)

## 2016-12-24 LAB — CBC
HCT: 31.9 % — ABNORMAL LOW (ref 36.0–46.0)
Hemoglobin: 10.1 g/dL — ABNORMAL LOW (ref 12.0–15.0)
MCH: 21.7 pg — ABNORMAL LOW (ref 26.0–34.0)
MCHC: 31.7 g/dL (ref 30.0–36.0)
MCV: 68.5 fL — ABNORMAL LOW (ref 78.0–100.0)
Platelets: 154 10*3/uL (ref 150–400)
RBC: 4.66 MIL/uL (ref 3.87–5.11)
RDW: 14.7 % (ref 11.5–15.5)
WBC: 12 10*3/uL — ABNORMAL HIGH (ref 4.0–10.5)

## 2016-12-24 MED ORDER — ISOSORBIDE MONONITRATE ER 30 MG PO TB24
30.0000 mg | ORAL_TABLET | Freq: Every day | ORAL | Status: DC
  Administered 2016-12-24 – 2016-12-25 (×2): 30 mg via ORAL
  Filled 2016-12-24 (×2): qty 1

## 2016-12-24 NOTE — Care Management Note (Addendum)
Case Management Note  Patient Details  Name: Ashley Goodwin MRN: [id number] Date of Birth: [birth year]  Subjective/Objective:   Transfer from [facility], from home alone , pta indep, was just mowing her grass last week per RN,  presents with STEMI, will be txt'd medically, on plavix.                 Action/Plan: NCM will follow for dc needs.   Expected Discharge Date:                  Expected Discharge Plan:     In-House Referral:     Discharge planning Services  CM Consult  Post Acute Care Choice:    Choice offered to:     DME Arranged:    DME Agency:     HH Arranged:    HH Agency:     Status of Service:  In process, will continue to follow  If discussed at Long Length of Stay Meetings, dates discussed:    Additional Comments:  Ashley Goodwin, Ashley Skoda Clinton, RN 12/24/2016, 10:53 AM

## 2016-12-24 NOTE — Progress Notes (Signed)
Transported to 4E17 via wheelchair and ambulated to bed without difficulty.  Patient awake, alert and oriented at time of transfer.  Jennifer,RN at bedside.  Called [place]au[place]annie (Dgt.) to notify of transfer.

## 2016-12-24 NOTE — Progress Notes (Signed)
Progress Note  Patient Name: Ashley Goodwin Date of Encounter: 12/24/2016  Primary Cardiologist: MS     Patient Profile      Ashley Goodwin is a 83 y.o. female with a past medical history of anemia. No prior cardiac history. Transferred from [facility] as an inferolateral STEMI. Elected to treat medically  UFH x 48 hr DAPT and statins   Subjective   Without complaints   No cp sob.  She is resting comfortably. She has not yet ambulated in the hallway.   Inpatient Medications    Scheduled Meds: . aspirin EC  81 mg Oral Daily  . clopidogrel  75 mg Oral Daily  . isosorbide mononitrate  30 mg Oral Daily  . metoprolol tartrate  25 mg Oral BID  . simvastatin  40 mg Oral q1800   Continuous Infusions: . sodium chloride 10 mL/hr at 12/24/16 1239   PRN Meds: acetaminophen, nitroGLYCERIN, ondansetron (ZOFRAN) IV   Vital Signs    Vitals:   12/24/16 0800 12/24/16 0900 12/24/16 1047 12/24/16 2115  BP: 118/61 124/62 125/64 (!) 116/56  Pulse: 71 85  79  Resp: (!) 22 (!) 21 20 (!) 23  Temp:   98.4 F (36.9 C) 99.6 F (37.6 C)  TempSrc:   Oral Oral  SpO2: 100% 100% 99% 99%  Weight:      Height:        Intake/Output Summary (Last 24 hours) at 12/24/16 2320 Last data filed at 12/24/16 0900  Gross per 24 hour  Intake              178 ml  Output              550 ml  Net             -372 ml   Filed Weights   12/21/16 1524 12/21/16 1803 12/23/16 2200  Weight: 160 lb (72.6 kg) 133 lb 13.1 oz (60.7 kg) 132 lb 7.9 oz (60.1 kg)    Telemetry    NSR with occasional PVCs /PACs - rates 70s-80s- Personally Reviewed  ECG    No new EKG  Physical Exam    Physical Exam  Constitutional: She is oriented to person, place, and time. She appears well-developed and well-nourished. No distress.  Very pleasant elderly woman who appears younger than her stated age. Well groomed.Healthy-appearing  HENT:  Head: Normocephalic and atraumatic.  Neck: Normal range of motion. Neck  supple. No hepatojugular reflux and no JVD present. Carotid bruit is not present.  Cardiovascular: Normal rate, regular rhythm, normal heart sounds and intact distal pulses.   Occasional extrasystoles are present. PMI is not displaced.  Exam reveals no gallop and no friction rub.   No murmur heard. Pulmonary/Chest: Effort normal and breath sounds normal. No respiratory distress. She has no wheezes. She has no rales.  Abdominal: Soft. Bowel sounds are normal. She exhibits no distension. There is no tenderness. There is no rebound.  Musculoskeletal: Normal range of motion. She exhibits no edema.  No clubbing or cyanosis  Neurological: She is alert and oriented to person, place, and time.  Skin: Skin is warm and dry. No rash noted. No erythema.  Psychiatric: She has a normal mood and affect. Her behavior is normal. Judgment and thought content normal.  Nursing note and vitals reviewed.   Labs    Chemistry  Recent Labs Lab 12/21/16 1520 12/22/16 0930 12/22/16 1217  NA 137 135 134*  K 5.6* 4.6 4.7  CL  103 103 101  CO2 25 19* 22  GLUCOSE 112* 112* 107*  BUN 20 13 14   CREATININE 1.18* 1.03* 1.09*  CALCIUM 9.5 9.3 9.4  GFRNONAA 39* 45* 42*  GFRAA 45* 53* 49*  ANIONGAP 9 13 11      Hematology  Recent Labs Lab 12/22/16 1217 12/23/16 0406 12/24/16 0228  WBC 7.8 13.6* 12.0*  RBC 5.61* 5.14* 4.66  HGB 12.2 11.4* 10.1*  HCT 38.7 35.4* 31.9*  MCV 69.0* 68.9* 68.5*  MCH 21.7* 22.2* 21.7*  MCHC 31.5 32.2 31.7  RDW 15.0 14.8 14.7  PLT 170 173 154    Cardiac Enzymes  Recent Labs Lab 12/21/16 2127 12/22/16 0559 12/22/16 1217 12/23/16 0911  TROPONINI 12.69* 19.54* 31.26* 25.84*     Recent Labs Lab 12/21/16 1532  TROPIPOC 1.69*     BNPNo results for input(s): BNP, PROBNP in the last 168 hours.   DDimer No results for input(s): DDIMER in the last 168 hours.   Radiology    No results found.  Cardiac Studies   Echo EF 50-55%; no RWMA, Gr 1 DD. mild  LAE.   Assessment & Plan  Principal Problem:   ST elevation myocardial infarction (STEMI) of inferior wall (HCC) Active Problems:   Hyperlipidemia with target LDL less than 70   Intermediate hyperglycemia without complication   Microcytic anemia  Per original decision, we chose to pursue conservative management of what was probably subacute presentation of her Inferior STEMI - she was chest pain free upon arrival to the ER, EKG showed signs of Q waves & TWI - suggesting completed infarct. -- as it was, her Troponin levels did go up, but not dramatically & her LVEF is preserved without significant WMA -- suggesting that she may well have had collaterals forming to her now occluded coronary artery. -- She is now pain free..  . The patient and her family were well in agreement with this and were adamant about not having invasive procedures. She has now completed her IV heparin treatment. -- No further angina/gas symptoms since stopping. -- Continue with aspirin and Plavix (closely monitoring hemoglobin levels as they have been slowly trending down - microcytic anemia) We will wean off nitroglycerin drip and switch to PO Imdur 30 mg. Increased metoprolol to 25 mg twice a day  HLD: She is on simvastatin 40 mg daily. Hyperglycemia: Blood glucose levels have been in the low 100s. Suspect she may have some prediabetes. Can check hemoglobin A1c with next labs. - Would defer management outpatient.  Overall, she seemed to do relatively well and we would consider the possibility of discharge tomorrow if she is able to inflate in the hallway without difficulty.  Signed, Bryan Lemmaavid Nusrat Encarnacion, MD  12/24/2016, 11:20 PM

## 2016-12-25 DIAGNOSIS — I471 Supraventricular tachycardia: Secondary | ICD-10-CM

## 2016-12-25 MED ORDER — METOPROLOL TARTRATE 50 MG PO TABS: 50.0000 mg | ORAL_TABLET | Freq: Two times a day (BID) | ORAL | Status: DC

## 2016-12-25 MED ORDER — SIMVASTATIN 40 MG PO TABS
40.0000 mg | ORAL_TABLET | Freq: Every day | ORAL | 1 refills | Status: AC
Start: 2016-12-25 — End: ?

## 2016-12-25 MED ORDER — CLOPIDOGREL BISULFATE 75 MG PO TABS: 75.0000 mg | ORAL_TABLET | Freq: Every day | ORAL | 1 refills | Status: AC

## 2016-12-25 MED ORDER — NITROGLYCERIN 0.4 MG SL SUBL: 0.4000 mg | SUBLINGUAL_TABLET | SUBLINGUAL | 2 refills | Status: AC | PRN

## 2016-12-25 MED ORDER — ISOSORBIDE MONONITRATE ER 30 MG PO TB24: 30.0000 mg | ORAL_TABLET | Freq: Every day | ORAL | 1 refills | Status: AC

## 2016-12-25 MED ORDER — ASPIRIN 81 MG PO TBEC: 81.0000 mg | DELAYED_RELEASE_TABLET | Freq: Every day | ORAL | Status: AC

## 2016-12-25 MED ORDER — METOPROLOL TARTRATE 50 MG PO TABS: 50.0000 mg | ORAL_TABLET | Freq: Two times a day (BID) | ORAL | 0 refills | Status: AC

## 2016-12-25 NOTE — Progress Notes (Signed)
Order received to discharge patient.  Telemetry monitor removed and CCMD notified.  PIV access removed.  Discharge instructions, follow up, medications and instructions for their use discussed with patient. 

## 2016-12-25 NOTE — Progress Notes (Addendum)
CARDIAC REHAB PHASE I   PRE:  Rate/Rhythm: 74 SR  BP:  Supine: 132/66  Sitting:   Standing:    SaO2: 99%RA  MODE:  Ambulation: 500 ft   POST:  Rate/Rhythm: 102 ST     Had run of SVT 163 while sitting and I was educating  BP:  Supine:   Sitting: 137/69  Standing:    SaO2: 99%RA 1020-1116 Pt walked 500 ft with gait belt use and holding her hand with steady gait. Tolerated well. Did not need to sit or stop to rest. Pt stated she is active and still drives and mows her lawn. MI education completed with her. Discussed walking for ex as tolerated and eating heart healthy. Will refer to [facility] as pt stated she would prefer to go there instead of [place] as daughters live in [place]. Reviewed NTG  Use. To recliner with call bell after walk.  Pt had short run of SVT at 163 while sitting and I was educating. Denied palpitations or feeling it.   Ashley Nuttingharlene Geraldyne Barraclough, RN BSN  12/25/2016 11:13 AM

## 2016-12-25 NOTE — Progress Notes (Addendum)
Progress Note  Patient Name: Ashley Goodwin Date of Encounter: 12/25/2016  Primary Cardiologist: MS     Patient Profile      Ashley Goodwin is a 83 y.o. female with a past medical history of anemia. No prior cardiac history. Transferred from [facility] as an inferolateral STEMI. Elected to treat medically  UFH x 48 hr DAPT and statins   Subjective   No complaints.  Walked in [place] with [facility] - did well. No Sx with short SVT run.   Inpatient Medications    Scheduled Meds: . aspirin EC  81 mg Oral Daily  . clopidogrel  75 mg Oral Daily  . isosorbide mononitrate  30 mg Oral Daily  . metoprolol tartrate  50 mg Oral BID  . simvastatin  40 mg Oral q1800   Continuous Infusions: . sodium chloride 10 mL/hr at 12/24/16 1239   PRN Meds: acetaminophen, nitroGLYCERIN, ondansetron (ZOFRAN) IV   Vital Signs    Vitals:   12/25/16 0017 12/25/16 0510 12/25/16 0909 12/25/16 1327  BP: 91/60 116/69 (!) 112/98 (!) 154/72  Pulse: 68 72 84 91  Resp:  (!) 25 (!) 22 (!) 25  Temp: 98.2 F (36.8 C) 97.6 F (36.4 C) 98 F (36.7 C) 97.8 F (36.6 C)  TempSrc: Oral Oral Oral Oral  SpO2: 99% 99% 99% 98%  Weight:      Height:        Intake/Output Summary (Last 24 hours) at 12/25/16 1426 Last data filed at 12/25/16 0600  Gross per 24 hour  Intake                0 ml  Output              300 ml  Net             -300 ml   Filed Weights   12/21/16 1524 12/21/16 1803 12/23/16 2200  Weight: 160 lb (72.6 kg) 133 lb 13.1 oz (60.7 kg) 132 lb 7.9 oz (60.1 kg)    Telemetry    NSR with occasional PVCs /PACs - rates 70s-80s; 1 short run SVT/PAT ~150 bpm x ~25 beats (aymptomatic)- Personally Reviewed  ECG    No new EKG  Physical Exam    Physical Exam  Constitutional: She is oriented to person, place, and time. She appears well-developed and well-nourished. No distress.  Very pleasant elderly woman who appears younger than her stated age. Well groomed.Healthy-appearing  HENT:   Head: Normocephalic and atraumatic.  Neck: Normal range of motion. Neck supple. No hepatojugular reflux and no JVD present. Carotid bruit is not present.  Cardiovascular: Normal rate, regular rhythm, normal heart sounds and intact distal pulses.   Occasional extrasystoles are present. PMI is not displaced.  Exam reveals no gallop and no friction rub.   No murmur heard. Pulmonary/Chest: Effort normal and breath sounds normal. No respiratory distress. She has no wheezes. She has no rales.  Abdominal: Soft. Bowel sounds are normal. She exhibits no distension. There is no tenderness.  Musculoskeletal: Normal range of motion. She exhibits no edema.  No clubbing or cyanosis  Neurological: She is alert and oriented to person, place, and time.  Skin: Skin is warm and dry. No rash noted. No erythema.  Psychiatric: She has a normal mood and affect. Her behavior is normal. Judgment and thought content normal.  Nursing note and vitals reviewed.   Labs    Chemistry  Recent Labs Lab 12/21/16 1520 12/22/16 0930 12/22/16 1217  NA 137 135 134*  K 5.6* 4.6 4.7  CL 103 103 101  CO2 25 19* 22  GLUCOSE 112* 112* 107*  BUN 20 13 14   CREATININE 1.18* 1.03* 1.09*  CALCIUM 9.5 9.3 9.4  GFRNONAA 39* 45* 42*  GFRAA 45* 53* 49*  ANIONGAP 9 13 11      Hematology  Recent Labs Lab 12/22/16 1217 12/23/16 0406 12/24/16 0228  WBC 7.8 13.6* 12.0*  RBC 5.61* 5.14* 4.66  HGB 12.2 11.4* 10.1*  HCT 38.7 35.4* 31.9*  MCV 69.0* 68.9* 68.5*  MCH 21.7* 22.2* 21.7*  MCHC 31.5 32.2 31.7  RDW 15.0 14.8 14.7  PLT 170 173 154    Cardiac Enzymes  Recent Labs Lab 12/21/16 2127 12/22/16 0559 12/22/16 1217 12/23/16 0911  TROPONINI 12.69* 19.54* 31.26* 25.84*     Recent Labs Lab 12/21/16 1532  TROPIPOC 1.69*     BNPNo results for input(s): BNP, PROBNP in the last 168 hours.   DDimer No results for input(s): DDIMER in the last 168 hours.   Radiology    No results found.  Cardiac Studies    Echo EF 50-55%; no RWMA, Gr 1 DD. mild LAE.   Assessment & Plan  Principal Problem:   ST elevation myocardial infarction (STEMI) of inferior wall (HCC) Active Problems:   Hyperlipidemia with target LDL less than 70   Intermediate hyperglycemia without complication   Microcytic anemia   PSVT  Per original decision, we chose to pursue conservative management of what was probably subacute presentation of her Inferior STEMI - she was chest pain free upon arrival to the ER, EKG showed signs of Q waves & TWI - suggesting completed infarct. -- as it was, her Troponin levels did go up, but not dramatically & her LVEF is preserved without significant WMA -- suggesting that she may well have had collaterals forming to her now occluded coronary artery. -- She is now pain free..  . The patient and her family were well in agreement with this and were adamant about not having invasive procedures. She has now completed her IV heparin treatment. -- No further angina/gas symptoms since stopping. --   Continues to progress well - no further CP weaned off NTG gtt -> to Imdur. Off IV Heparin. Plan:  Continue with aspirin and Plavix - Hgb relatively stable.  Continue PO Imdur 30 mg.  Increased metoprolol to 50 mg twice a day (BP will tolerate & long run PSVT)  HLD: continue current dose of  simvastatin 40 mg daily. - has tolerated in the past. Hyperglycemia: Blood glucose levels have been in the low 100s. Suspect she may have some prediabetes. -- Defer to PCP for OP management.   Doing well - stable for d/c home.  Will arrange OP f/u in [place] for Cardiology & [facility].    Signed, Bryan Lemmaavid Harding, MD  12/25/2016, 2:26 PM

## 2016-12-25 NOTE — Discharge Summary (Signed)
Discharge Summary    Patient ID: Ashley Goodwin,  MRN: [id number], DOB/AGE: 09/30/1921 83 y.o.  Admit date: 12/21/2016 Discharge date: 12/25/2016  Primary Care Provider: Patient, No Pcp Per Primary Cardiologist: Otto HerbNew ([place] while inpatient)  Discharge Diagnoses    Principal Problem:   ST elevation myocardial infarction (STEMI) of inferior wall (HCC) Active Problems:   Hyperlipidemia with target LDL less than 70   Intermediate hyperglycemia without complication   Microcytic anemia   PSVT (paroxysmal supraventricular tachycardia) (HCC)   Allergies No Known Allergies  Diagnostic Studies/Procedures    TTE: 12/22/16  Study Conclusions  - Left ventricle: The cavity size was normal. There was moderate   focal basal hypertrophy of the septum. Systolic function was   normal. The estimated ejection fraction was in the range of 50%   to 55%. Wall motion was normal; there were no regional wall   motion abnormalities. Doppler parameters are consistent with   abnormal left ventricular relaxation (grade 1 diastolic   dysfunction). Doppler parameters are consistent with high   ventricular filling pressure. - Aortic valve: Transvalvular velocity was within the normal range.   There was no stenosis. There was no regurgitation. - Mitral valve: Calcified annulus. Transvalvular velocity was   within the normal range. There was no evidence for stenosis.   There was trivial regurgitation. - Left atrium: The atrium was mildly dilated. - Right ventricle: The cavity size was normal. Wall thickness was   normal. Systolic function was normal. - Atrial septum: No defect or patent foramen ovale was identified. - Tricuspid valve: There was mild regurgitation. - Pulmonary arteries: Systolic pressure was within the normal   range. PA peak pressure: 37 mm Hg (S). _____________   History of Present Illness     Ashley Goodwin is a 83 year old female with no past cardiac history who  presented to [facility] on 12/21/16 with symptoms of GERD and upper epigastric pain. EKG showed inferolateral ST elevation. Initial troponin was 1.69. She said she had felt gas pains for the past 2 weeks, but they worsened over the past 2 days prior to admission. On 10/19 she had pain under her left breast with no associated SOB or diaphoresis. Of note, she did say about 2 years ago, she underwent GI study for GI bleeding. She had a transfusion and underwent what sounds like EGD, but said that nothing was found.   She did not have a PCP, takes no medications other than OTC Gas X. She remains active, she mowed her lawn 4 days ago. 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   She did receive a heparin bolus at [facility] ED, but currently not on a heparin gtt. Denies orthopnea, PND, syncope, palpitations.   Hospital Course     On arrival she was pain-free, on heparin drip. Given her advanced age plan was to not pursue at that point. Troponin cycled and peaked at 31.26. She was started on aspirin, Plavix, atorvastatin. She was continued on heparin for 48 hours then discontinued. She remained pain-free, and both patient and family members were adamant about not having further invasive procedures. She was weaned off nitroglycerin and switched to Imdur 30 mg daily. Metoprolol was increased to 25 mg twice a day. Hemoglobin did trend down from 12.2>> 10.1, but remains stable with no active signs of bleeding. She was seen by cardiac rehabilitation and did well with no recurrent chest pain. Did have an episode of PSVT,  and metoprolol was increased to 50 mg twice a day with blood pressure tolerating.  Ashley Goodwin was seen by Dr. Herbie Baltimore and determined stable for discharge home. Follow up in the office has been arranged. Medications are listed below.   _____________  Discharge Vitals Blood pressure (!) 154/72, pulse 91, temperature 97.8 F (36.6 C), temperature source Oral, resp.  rate (!) 25, height 5\' 7"  (1.702 m), weight 132 lb 7.9 oz (60.1 kg), SpO2 98 %.  Filed Weights   12/21/16 1524 12/21/16 1803 12/23/16 2200  Weight: 160 lb (72.6 kg) 133 lb 13.1 oz (60.7 kg) 132 lb 7.9 oz (60.1 kg)  Constitutional: She is oriented to person, place, and time. She appears well-developed and well-nourished. No distress.  Very pleasant elderly woman who appears younger than her stated age. Well groomed.Healthy-appearing  HENT:  Head: Normocephalic and atraumatic.  Neck: Normal range of motion. Neck supple. No hepatojugular reflux and no JVD present. Carotid bruit is not present.  Cardiovascular: Normal rate, regular rhythm, normal heart sounds and intact distal pulses.   Occasional extrasystoles are present. PMI is not displaced.  Exam reveals no gallop and no friction rub.   No murmur heard. Pulmonary/Chest: Effort normal and breath sounds normal. No respiratory distress. She has no wheezes. She has no rales.  Abdominal: Soft. Bowel sounds are normal. She exhibits no distension. There is no tenderness.  Musculoskeletal: Normal range of motion. She exhibits no edema.  No clubbing or cyanosis  Neurological: She is alert and oriented to person, place, and time.  Skin: Skin is warm and dry. No rash noted. No erythema.  Psychiatric: She has a normal mood and affect. Her behavior is normal. Judgment and thought content normal.  Nursing note and vitals reviewed.  Labs & Radiologic Studies    CBC  Recent Labs  12/23/16 0406 12/24/16 0228  WBC 13.6* 12.0*  HGB 11.4* 10.1*  HCT 35.4* 31.9*  MCV 68.9* 68.5*  PLT 173 154   Basic Metabolic Panel No results for input(s): NA, K, CL, CO2, GLUCOSE, BUN, CREATININE, CALCIUM, MG, PHOS in the last 72 hours. Liver Function Tests No results for input(s): AST, ALT, ALKPHOS, BILITOT, PROT, ALBUMIN in the last 72 hours. No results for input(s): LIPASE, AMYLASE in the last 72 hours. Cardiac Enzymes  Recent Labs  12/23/16 0911    TROPONINI 25.84*   BNP Invalid input(s): POCBNP D-Dimer No results for input(s): DDIMER in the last 72 hours. Hemoglobin A1C No results for input(s): HGBA1C in the last 72 hours. Fasting Lipid Panel No results for input(s): CHOL, HDL, LDLCALC, TRIG, CHOLHDL, LDLDIRECT in the last 72 hours. Thyroid Function Tests No results for input(s): TSH, T4TOTAL, T3FREE, THYROIDAB in the last 72 hours.  Invalid input(s): FREET3 _____________  Dg Chest Port 1 View  Result Date: 12/21/2016 CLINICAL DATA:  83 year old female with chest pain EXAM: PORTABLE CHEST 1 VIEW COMPARISON:  Prior chest x-ray 01/11/2014 FINDINGS: Stable cardiac and mediastinal contours. Atherosclerotic calcifications again noted throughout the transverse aorta. Inspiratory volumes are low. Slightly increased bibasilar atelectasis. No pleural effusion, pneumothorax or focal airspace consolidation. Stable background bronchitic changes and emphysema. No acute osseous abnormality. IMPRESSION: 1. Slightly low inspiratory volumes and bibasilar atelectasis. Otherwise, no acute cardiopulmonary process. 2. Aortic Atherosclerosis (ICD10-170.0) 3. Chronic bronchitic changes and emphysema. Electronically Signed   By: Malachy Moan M.D.   On: 12/21/2016 16:18   Disposition   Pt is being discharged home today in good condition.  Follow-up Plans & Appointments  Follow-up Information    Dyann Kief, PA-C Follow up on 01/02/2017.   Specialty:  Cardiology Why:  at 1pm for your follow up appt.  Contact information: [address] [place] [address] [phone]          Discharge Instructions    Amb Referral to Cardiac Rehabilitation    Complete by:  As directed    Diagnosis:  STEMI   Diet - low sodium heart healthy    Complete by:  As directed    Discharge instructions    Complete by:  As directed    PLEASE DO NOT MISS ANY DOSES OF YOUR PLAVIX!!!!! Also keep a log of you blood pressures and bring back to your follow  up appt. Please call the office with any questions.   Patients taking blood thinners should generally stay away from medicines like ibuprofen, Advil, Motrin, naproxen, and Aleve due to risk of stomach bleeding. You may take Tylenol as directed or talk to your primary doctor about alternatives.   Increase activity slowly    Complete by:  As directed       Discharge Medications     Medication List    STOP taking these medications   aspirin 325 MG tablet Replaced by:  aspirin 81 MG EC tablet     TAKE these medications   acetaminophen 500 MG tablet Commonly known as:  TYLENOL Take 500 mg by mouth every 6 (six) hours as needed. For pain   aspirin 81 MG EC tablet Take 1 tablet (81 mg total) by mouth daily. Replaces:  aspirin 325 MG tablet   clopidogrel 75 MG tablet Commonly known as:  PLAVIX Take 1 tablet (75 mg total) by mouth daily.   isosorbide mononitrate 30 MG 24 hr tablet Commonly known as:  IMDUR Take 1 tablet (30 mg total) by mouth daily.   metoprolol tartrate 50 MG tablet Commonly known as:  LOPRESSOR Take 1 tablet (50 mg total) by mouth 2 (two) times daily.   nitroGLYCERIN 0.4 MG SL tablet Commonly known as:  NITROSTAT Place 1 tablet (0.4 mg total) under the tongue every 5 (five) minutes x 3 doses as needed for chest pain.   simvastatin 40 MG tablet Commonly known as:  ZOCOR Take 1 tablet (40 mg total) by mouth daily at 6 PM.   VITAMIN B1-B12 IM Inject 1 mL into the muscle every 30 (thirty) days.   Vitamin D3 5000 units Caps Take 1 capsule by mouth 2 (two) times daily.        Aspirin prescribed at discharge?  Yes Statin: On simvastatin as she has tolerated this in the past  Beta Blocker Prescribed? Yes For EF <40%, was ACEI/ARB Prescribed? No: EF ok ADP Receptor Inhibitor Prescribed? (i.e. Plavix etc.-Includes Medically Managed Patients): Yes For EF <40%, Aldosterone Inhibitor Prescribed? No: EF ok Was EF assessed during THIS hospitalization?  Yes Was Cardiac Rehab II ordered? (Included Medically managed Patients): Yes   Outstanding Labs/Studies   FLP/LFTs in 6 weeks if tolerating statin   Duration of Discharge Encounter   Greater than 30 minutes including physician time.  Signed, Laverda Page NP-C 12/25/2016, 3:41 PM   I have seen, examined and evaluated the patient this PM along with Laverda Page, NP-C.  After reviewing all the available data and chart, we discussed the patients laboratory, study & physical findings as well as symptoms in detail. I agree with her findings, my examination as well as the d/c summary & follow-on Rx recommendations  as per our discussion.    Per original decision, we chose to pursue conservative management of what was probably subacute presentation of her Inferior STEMI - she was chest pain free upon arrival to the ER, EKG showed signs of Q waves & TWI - suggesting completed infarct. -- as it was, her Troponin levels did go up, but not dramatically & her LVEF is preserved without significant WMA -- suggesting that she may well have had collaterals forming to her now occluded coronary artery. -- She is now pain free..  . The patient and her family were well in agreement with this and were adamant about not having invasive procedures. She has now completed her IV heparin treatment. -- No further angina/gas symptoms since stopping. --   Continues to progress well - no further CP weaned off NTG gtt -> to Imdur. Off IV Heparin. Plan:  Continue with aspirin and Plavix - Hgb relatively stable.  Continue PO Imdur 30 mg.  Increased metoprolol to 50 mg twice a day (BP will tolerate & long run PSVT)  HLD: continue current dose of  simvastatin 40 mg daily. - has tolerated in the past. Hyperglycemia: Blood glucose levels have been in the low 100s. Suspect she may have some prediabetes. -- Defer to PCP for OP management.   Doing well - stable for d/c home.  Will arrange OP f/u in [place]  for Cardiology & CRH.     Bryan Lemma, M.D., M.S. Interventional Cardiologist   Pager # [phone] Phone # [phone] [address] [place], [place] [address]

## 2016-12-26 LAB — HEMOGLOBIN A1C
Hgb A1c MFr Bld: 5.6 % (ref 4.8–5.6)
MEAN PLASMA GLUCOSE: 114 mg/dL

## 2017-01-01 NOTE — Progress Notes (Signed)
Cardiology Office Note    Date:  01/02/2017   ID:  Ashley Goodwin, DOB [birth year], MRN [id number]  PCP:  Patient, No Pcp Per  Cardiologist: New saw Dr. Anne Fu in hospital but to be established in [facility] Complaint  Patient presents with  . Follow-up    History of Present Illness:  Ashley Goodwin is a very pleasant 83 y.o. female with no prior cardiac history on no meds admitted with a STEMI 12/21/16 with peak troponin of 31.26.  She was treated with Plavix aspirin and atorvastatin.  The patient and family did not want any invasive procedures.  Hemoglobin did trend down from 12.2-10.1 but there was no active signs of bleeding.  She had an episode of PSVT and metoprolol was increased to 50 mg twice daily.  2D echo showed normal LV function ejection fraction 50-55% with grade 1 DD high ventricular filling pressures.  Patient comes in today accompanied by her daughter.  She is back to her regular activity cleaning her house and taking care of her son who has had a stroke.  She denies any chest pressure or indigestion type symptoms.  She is taking all her medications regularly and feels great.  Denies any bleeding difficulties.    Past Medical History:  Diagnosis Date  . Anemia   . Hypotension     Past Surgical History:  Procedure Laterality Date  . ABDOMINAL SURGERY    . esophagus burst      Current Medications: Current Meds  Medication Sig  . acetaminophen (TYLENOL) 500 MG tablet Take 500 mg by mouth every 6 (six) hours as needed. For pain   . aspirin EC 81 MG EC tablet Take 1 tablet (81 mg total) by mouth daily.  . Cholecalciferol (VITAMIN D3) 5000 units CAPS Take 1 capsule by mouth 2 (two) times daily.  . clopidogrel (PLAVIX) 75 MG tablet Take 1 tablet (75 mg total) by mouth daily.  . isosorbide mononitrate (IMDUR) 30 MG 24 hr tablet Take 1 tablet (30 mg total) by mouth daily.  . metoprolol tartrate (LOPRESSOR) 50 MG tablet Take 1 tablet (50 mg total) by  mouth 2 (two) times daily.  . nitroGLYCERIN (NITROSTAT) 0.4 MG SL tablet Place 1 tablet (0.4 mg total) under the tongue every 5 (five) minutes x 3 doses as needed for chest pain.  . simvastatin (ZOCOR) 40 MG tablet Take 1 tablet (40 mg total) by mouth daily at 6 PM.  . VITAMIN B1-B12 IM Inject 1 mL into the muscle every 30 (thirty) days.       Allergies:   Patient has no known allergies.   Social History   Social History  . Marital status: Married    Spouse name: N/A  . Number of children: N/A  . Years of education: N/A   Social History Main Topics  . Smoking status: Former Games developer  . Smokeless tobacco: Never Used  . Alcohol use No  . Drug use: No  . Sexual activity: Not Asked   Other Topics Concern  . None   Social History Narrative   ** Merged History Encounter **         Family History:  The patient's family history includes Hypertension in her maternal grandmother.   ROS:   Please see the history of present illness.    Review of Systems  Constitution: Negative.  HENT: Negative.   Eyes: Negative.   Cardiovascular: Negative.   Respiratory: Negative.   Hematologic/Lymphatic: Negative.   Musculoskeletal:  Negative.  Negative for joint pain.  Gastrointestinal: Negative.   Genitourinary: Negative.   Neurological: Negative.    All other systems reviewed and are negative.   PHYSICAL EXAM:   VS:  BP (!) 148/78   Pulse 87   Ht 5\' 7"  (1.702 m)   Wt 135 lb 6.4 oz (61.4 kg)   SpO2 99% Comment: on room air  BMI 21.21 kg/m   Physical Exam  GEN: Well nourished, well developed, in no acute distress  Neck: no JVD, carotid bruits, or masses Cardiac:RRR; no murmurs, rubs, or gallops  Respiratory:  clear to auscultation bilaterally, normal work of breathing GI: soft, nontender, nondistended, + BS Ext: without cyanosis, clubbing, or edema, Good distal pulses bilaterally Neuro:  Alert and Oriented x 3 Psych: euthymic mood, full affect  Wt Readings from Last 3 Encounters:   01/02/17 135 lb 6.4 oz (61.4 kg)  12/23/16 132 lb 7.9 oz (60.1 kg)  05/09/16 140 lb (63.5 kg)      Studies/Labs Reviewed:   EKG:  EKG is  ordered today.  The ekg ordered today demonstrates normal sinus rhythm with T wave inversion in inferolateral improved from EKGs in the hospital  Recent Labs: 12/22/2016: BUN 14; Creatinine, Ser 1.09; Potassium 4.7; Sodium 134 12/24/2016: Hemoglobin 10.1; Platelets 154   Lipid Panel    Component Value Date/Time   CHOL 236 (H) 12/22/2016 0559   TRIG 104 12/22/2016 0559   HDL 52 12/22/2016 0559   CHOLHDL 4.5 12/22/2016 0559   VLDL 21 12/22/2016 0559   LDLCALC 163 (H) 12/22/2016 0559    Additional studies/ records that were reviewed today include:  2D echo 10/20/18Study Conclusions   - Left ventricle: The cavity size was normal. There was moderate   focal basal hypertrophy of the septum. Systolic function was   normal. The estimated ejection fraction was in the range of 50%   to 55%. Wall motion was normal; there were no regional wall   motion abnormalities. Doppler parameters are consistent with   abnormal left ventricular relaxation (grade 1 diastolic   dysfunction). Doppler parameters are consistent with high   ventricular filling pressure. - Aortic valve: Transvalvular velocity was within the normal range.   There was no stenosis. There was no regurgitation. - Mitral valve: Calcified annulus. Transvalvular velocity was   within the normal range. There was no evidence for stenosis.   There was trivial regurgitation. - Left atrium: The atrium was mildly dilated. - Right ventricle: The cavity size was normal. Wall thickness was   normal. Systolic function was normal. - Atrial septum: No defect or patent foramen ovale was identified. - Tricuspid valve: There was mild regurgitation. - Pulmonary arteries: Systolic pressure was within the normal   range. PA peak pressure: 37 mm Hg (S).       ASSESSMENT:    1. ST elevation  myocardial infarction (STEMI) of inferior wall (HCC)   2. PSVT (paroxysmal supraventricular tachycardia) (HCC)   3. Hyperlipidemia with target LDL less than 70      PLAN:  In order of problems listed above:  Status post late presenting inferior STEMI 12/21/16 with conservative management.  2D echo with no regional wall motion abnormal ejection fraction 50-55% with grade 1 DD.  Patient doing well without symptoms and has not limited her activity.  Continue Plavix and aspirin, metoprolol, Imdur, and simvastatin.  Follow-up with Dr. Wyline Mood in 2 months to be established here.  PSVT no symptoms of recurrence.  Continue current dose metoprolol  Hyperlipidemia on simvastatin.  Should have repeat lipids and LFTs in 6 weeks    Medication Adjustments/Labs and Tests Ordered: Current medicines are reviewed at length with the patient today.  Concerns regarding medicines are outlined above.  Medication changes, Labs and Tests ordered today are listed in the Patient Instructions below. There are no Patient Instructions on file for this visit.   Signed, Jacolyn ReedyMichele Lenze, PA-C  01/02/2017 1:47 PM    [facility] [address], [place], [place]  [address] Phone: [phone]; Fax: [phone]

## 2017-01-02 ENCOUNTER — Ambulatory Visit (INDEPENDENT_AMBULATORY_CARE_PROVIDER_SITE_OTHER): Payer: Medicare HMO | Admitting: Physician Assistant

## 2017-01-02 ENCOUNTER — Encounter: Payer: Self-pay | Admitting: Physician Assistant

## 2017-01-02 ENCOUNTER — Other Ambulatory Visit (HOSPITAL_COMMUNITY)
Admission: RE | Admit: 2017-01-02 | Discharge: 2017-01-02 | Disposition: A | Discharge status: Home / Self Care | Payer: Medicare HMO | Source: Ambulatory Visit | Attending: Physician Assistant | Admitting: Physician Assistant

## 2017-01-02 VITALS — BP 148/78 | HR 87 | Ht 67.0 in | Wt 135.4 lb

## 2017-01-02 DIAGNOSIS — E785 Hyperlipidemia, unspecified: Secondary | ICD-10-CM | POA: Insufficient documentation

## 2017-01-02 DIAGNOSIS — I471 Supraventricular tachycardia: Secondary | ICD-10-CM | POA: Diagnosis not present

## 2017-01-02 DIAGNOSIS — I2119 ST elevation (STEMI) myocardial infarction involving other coronary artery of inferior wall: Secondary | ICD-10-CM | POA: Insufficient documentation

## 2017-01-02 LAB — CBC WITH DIFFERENTIAL/PLATELET
BASOS PCT: 0 %
Basophils Absolute: 0 10*3/uL (ref 0.0–0.1)
EOS ABS: 0.1 10*3/uL (ref 0.0–0.7)
Eosinophils Relative: 2 %
HCT: 31.9 % — ABNORMAL LOW (ref 36.0–46.0)
Hemoglobin: 10.1 g/dL — ABNORMAL LOW (ref 12.0–15.0)
LYMPHS ABS: 2 10*3/uL (ref 0.7–4.0)
Lymphocytes Relative: 37 %
MCH: 22.1 pg — AB (ref 26.0–34.0)
MCHC: 31.7 g/dL (ref 30.0–36.0)
MCV: 69.7 fL — ABNORMAL LOW (ref 78.0–100.0)
MONO ABS: 0.5 10*3/uL (ref 0.1–1.0)
Monocytes Relative: 10 %
NEUTROS ABS: 2.8 10*3/uL (ref 1.7–7.7)
NEUTROS PCT: 51 %
PLATELETS: 351 10*3/uL (ref 150–400)
RBC: 4.58 MIL/uL (ref 3.87–5.11)
RDW: 14 % (ref 11.5–15.5)
WBC: 5.4 10*3/uL (ref 4.0–10.5)

## 2017-01-02 LAB — BASIC METABOLIC PANEL
Anion gap: 11 (ref 5–15)
BUN: 25 mg/dL — AB (ref 6–20)
CALCIUM: 9.4 mg/dL (ref 8.9–10.3)
CO2: 22 mmol/L (ref 22–32)
CREATININE: 1.13 mg/dL — AB (ref 0.44–1.00)
Chloride: 106 mmol/L (ref 101–111)
GFR calc Af Amer: 47 mL/min — ABNORMAL LOW (ref >60)
GFR, EST NON AFRICAN AMERICAN: 41 mL/min — AB (ref >60)
GLUCOSE: 100 mg/dL — AB (ref 65–99)
Potassium: 4.5 mmol/L (ref 3.5–5.1)
SODIUM: 139 mmol/L (ref 135–145)

## 2017-01-02 NOTE — Patient Instructions (Signed)
Medication Instructions:  Your physician recommends that you continue on your current medications as directed. Please refer to the Current Medication list given to you today.   Labwork: Your physician recommends that you return for lab work in: Today, and in 6 Weeks    Testing/Procedures: NONE   Follow-Up: Your physician recommends that you schedule a follow-up appointment in: 2 Months with Dr. Wyline MoodBranch    Any Other Special Instructions Will Be Listed Below (If Applicable).     If you need a refill on your cardiac medications before your next appointment, please call your pharmacy.

## 2017-03-12 ENCOUNTER — Encounter: Payer: Self-pay | Admitting: Cardiology

## 2017-03-12 ENCOUNTER — Ambulatory Visit: Payer: Self-pay | Admitting: Cardiology

## 2017-03-12 NOTE — Progress Notes (Deleted)
Clinical Summary Ashley Goodwin is a 84 y.o.female last seen by PA Lenze, this is our first visit together.  1. CAD - history of STEMI 12/2016. Peak trop 31 - due to advanced age and patient/family request patient was managed medically - 12/2016 echo LVEF 50-55%, no WMAs, grade I diastolic dysfunction with elevated LA pressure    2. PSVT - episode during 12/2016, has been on metoprolol  3. HL     Past Medical History:  Diagnosis Date  . Anemia   . Hypotension      No Known Allergies   Current Outpatient Medications  Medication Sig Dispense Refill  . acetaminophen (TYLENOL) 500 MG tablet Take 500 mg by mouth every 6 (six) hours as needed. For pain     . aspirin EC 81 MG EC tablet Take 1 tablet (81 mg total) by mouth daily.    . Cholecalciferol (VITAMIN D3) 5000 units CAPS Take 1 capsule by mouth 2 (two) times daily.    . clopidogrel (PLAVIX) 75 MG tablet Take 1 tablet (75 mg total) by mouth daily. 90 tablet 1  . isosorbide mononitrate (IMDUR) 30 MG 24 hr tablet Take 1 tablet (30 mg total) by mouth daily. 90 tablet 1  . metoprolol tartrate (LOPRESSOR) 50 MG tablet Take 1 tablet (50 mg total) by mouth 2 (two) times daily. 180 tablet 0  . nitroGLYCERIN (NITROSTAT) 0.4 MG SL tablet Place 1 tablet (0.4 mg total) under the tongue every 5 (five) minutes x 3 doses as needed for chest pain. 25 tablet 2  . simvastatin (ZOCOR) 40 MG tablet Take 1 tablet (40 mg total) by mouth daily at 6 PM. 30 tablet 1  . VITAMIN B1-B12 IM Inject 1 mL into the muscle every 30 (thirty) days.       No current facility-administered medications for this visit.      Past Surgical History:  Procedure Laterality Date  . ABDOMINAL SURGERY    . esophagus burst       No Known Allergies    Family History  Problem Relation Age of Onset  . Hypertension Maternal Grandmother      Social History Ashley Goodwin reports that she has quit smoking. she has never used smokeless tobacco. Ashley Goodwin  reports that she does not drink alcohol.   Review of Systems CONSTITUTIONAL: No weight loss, fever, chills, weakness or fatigue.  HEENT: Eyes: No visual loss, blurred vision, double vision or yellow sclerae.No hearing loss, sneezing, congestion, runny nose or sore throat.  SKIN: No rash or itching.  CARDIOVASCULAR:  RESPIRATORY: No shortness of breath, cough or sputum.  GASTROINTESTINAL: No anorexia, nausea, vomiting or diarrhea. No abdominal pain or blood.  GENITOURINARY: No burning on urination, no polyuria NEUROLOGICAL: No headache, dizziness, syncope, paralysis, ataxia, numbness or tingling in the extremities. No change in bowel or bladder control.  MUSCULOSKELETAL: No muscle, back pain, joint pain or stiffness.  LYMPHATICS: No enlarged nodes. No history of splenectomy.  PSYCHIATRIC: No history of depression or anxiety.  ENDOCRINOLOGIC: No reports of sweating, cold or heat intolerance. No polyuria or polydipsia.  Marland Kitchen   Physical Examination There were no vitals filed for this visit. There were no vitals filed for this visit.  Gen: resting comfortably, no acute distress HEENT: no scleral icterus, pupils equal round and reactive, no palptable cervical adenopathy,  CV Resp: Clear to auscultation bilaterally GI: abdomen is soft, non-tender, non-distended, normal bowel sounds, no hepatosplenomegaly MSK: extremities are warm, no edema.  Skin: warm,  no rash Neuro:  no focal deficits Psych: appropriate affect   Diagnostic Studies  12/2016 echo Study Conclusions  - Left ventricle: The cavity size was normal. There was moderate   focal basal hypertrophy of the septum. Systolic function was   normal. The estimated ejection fraction was in the range of 50%   to 55%. Wall motion was normal; there were no regional wall   motion abnormalities. Doppler parameters are consistent with   abnormal left ventricular relaxation (grade 1 diastolic   dysfunction). Doppler parameters are  consistent with high   ventricular filling pressure. - Aortic valve: Transvalvular velocity was within the normal range.   There was no stenosis. There was no regurgitation. - Mitral valve: Calcified annulus. Transvalvular velocity was   within the normal range. There was no evidence for stenosis.   There was trivial regurgitation. - Left atrium: The atrium was mildly dilated. - Right ventricle: The cavity size was normal. Wall thickness was   normal. Systolic function was normal. - Atrial septum: No defect or patent foramen ovale was identified. - Tricuspid valve: There was mild regurgitation. - Pulmonary arteries: Systolic pressure was within the normal   range. PA peak pressure: 37 mm Hg (S).   Assessment and Plan        Ashley Goodwin, M.D., F.A.C.C.

## 2017-07-04 DIAGNOSIS — H47292 Other optic atrophy, left eye: Secondary | ICD-10-CM | POA: Diagnosis not present

## 2017-07-22 DIAGNOSIS — L259 Unspecified contact dermatitis, unspecified cause: Secondary | ICD-10-CM | POA: Diagnosis not present

## 2017-12-01 IMAGING — CT CT RENAL STONE PROTOCOL
2 of 4 series · 17 of 46 positions shown, 19 images · non-contrast
Comparison: 12/26/2010

CLINICAL DATA: Left flank pain

EXAM:
CT ABDOMEN AND PELVIS WITHOUT CONTRAST
TECHNIQUE: Multidetector CT imaging of the abdomen and pelvis was performed
following the standard protocol without IV contrast.

[Series 2: stone study 5.0 i30f 1 · axial · 0.64mm/px · z∈[+947,+1317]mm · 14 of 82 slices shown, 16 images]
[im 4/82  soft-tissue]
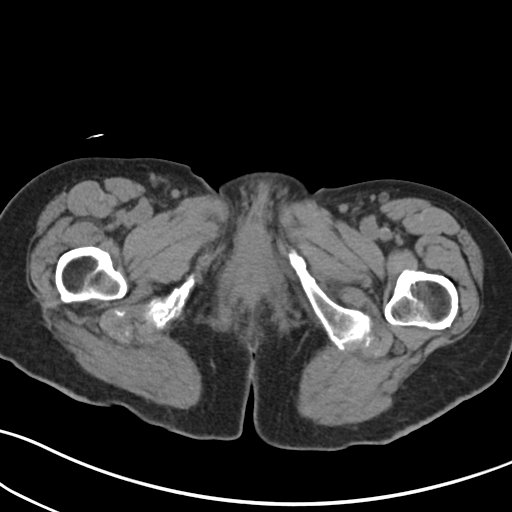
[im 4/82  bone]
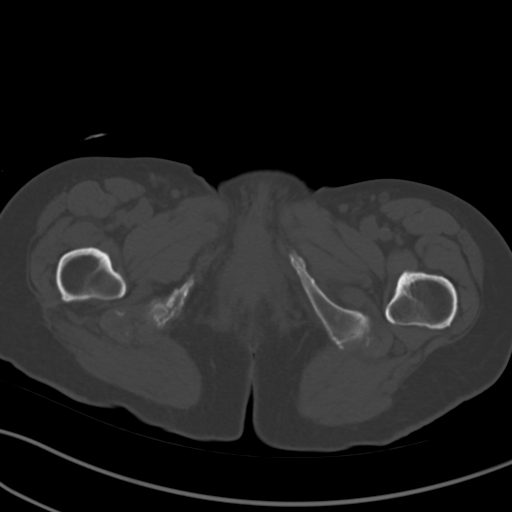
[im 10/82  soft-tissue]
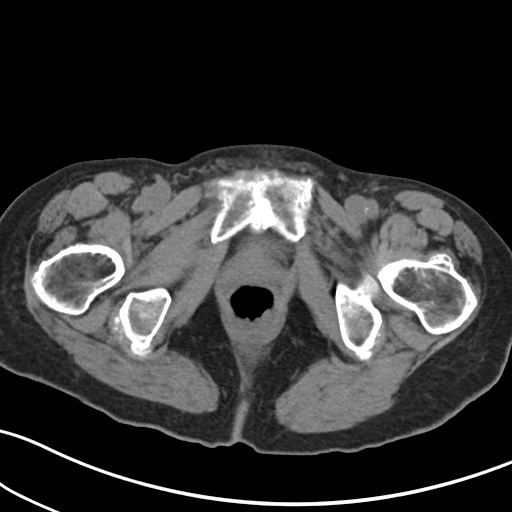
[im 17/82  soft-tissue]
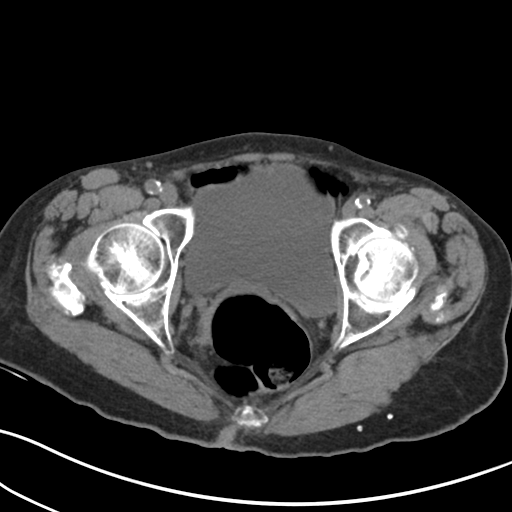
[im 23/82  soft-tissue]
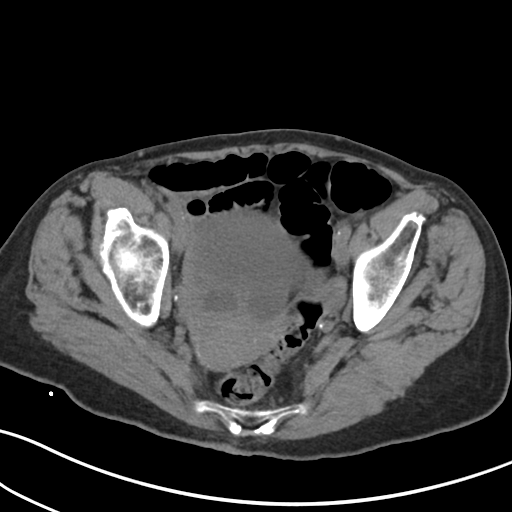
[im 26/82  soft-tissue]
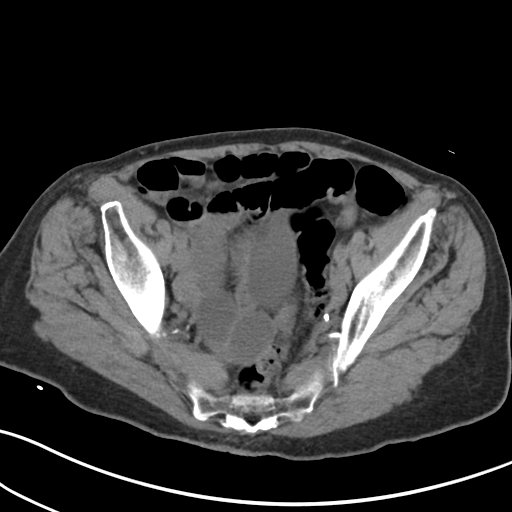
[im 33/82  soft-tissue]
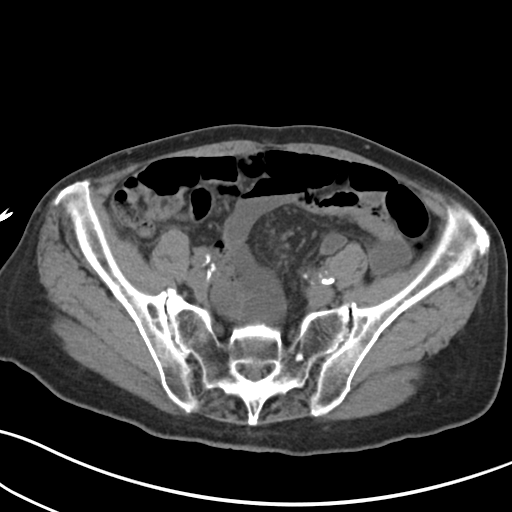
[im 39/82  soft-tissue]
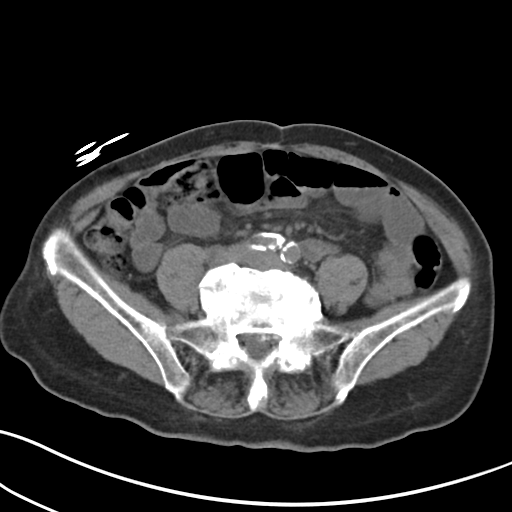
[im 43/82  soft-tissue]
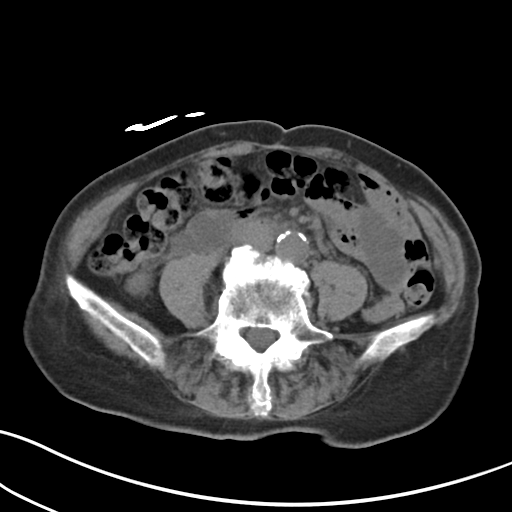
[im 49/82  soft-tissue]
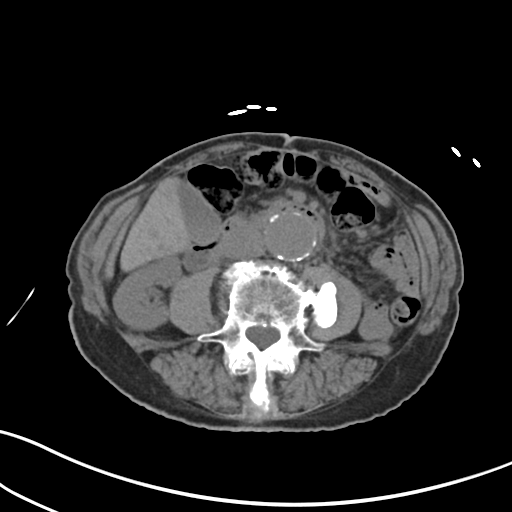
[im 49/82  bone]
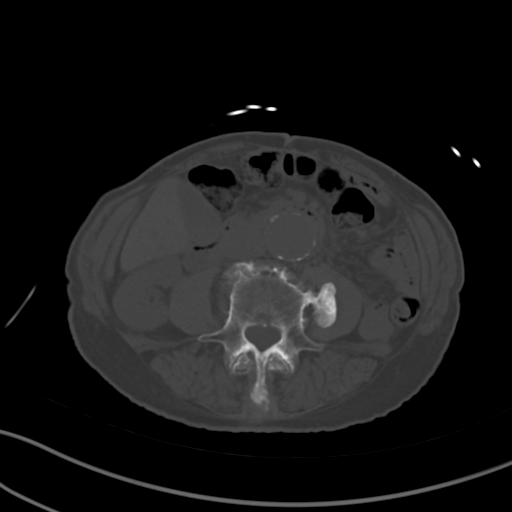
[im 56/82  soft-tissue]
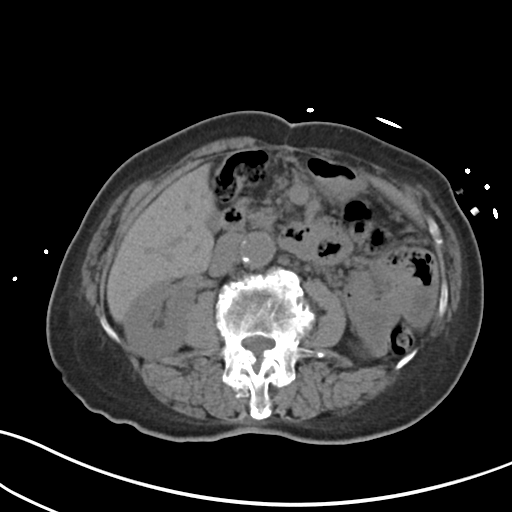
[im 62/82  soft-tissue]
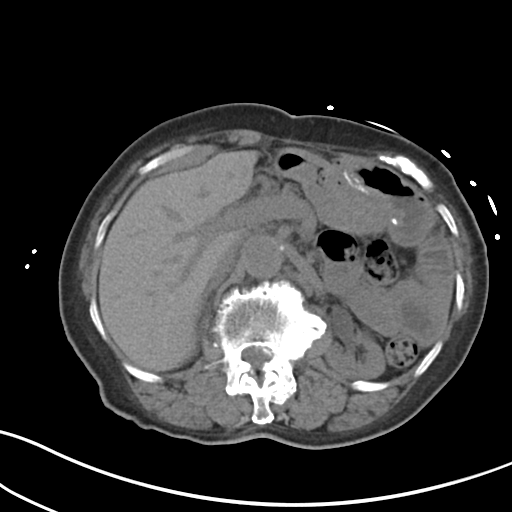
[im 65/82  soft-tissue]
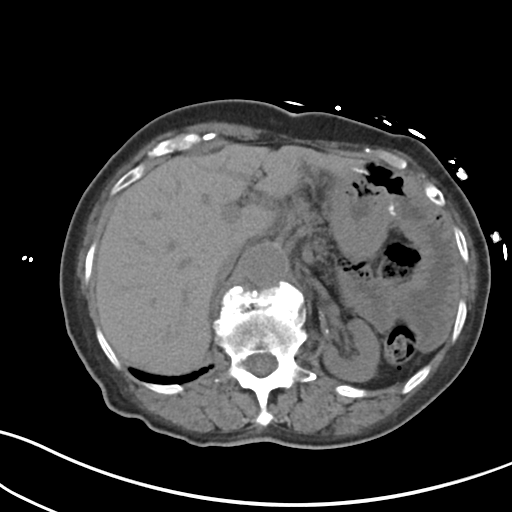
[im 72/82  soft-tissue]
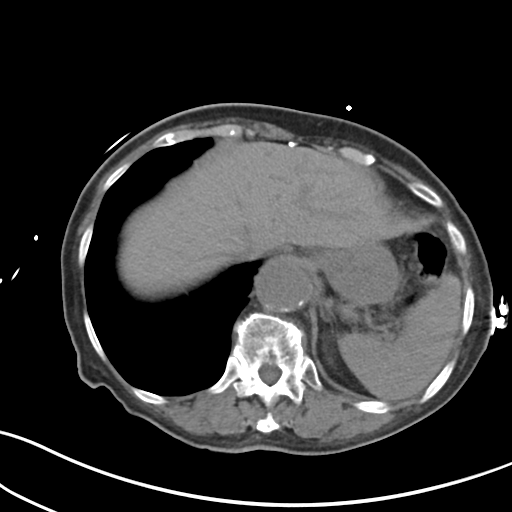
[im 78/82  soft-tissue]
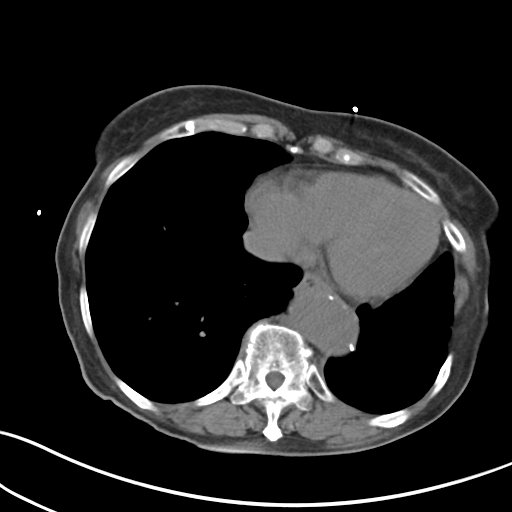

[Series 5: coronal soft tissue · coronal · 0.66mm/px · 3 of 63 slices shown]
[im 21/63  soft-tissue]
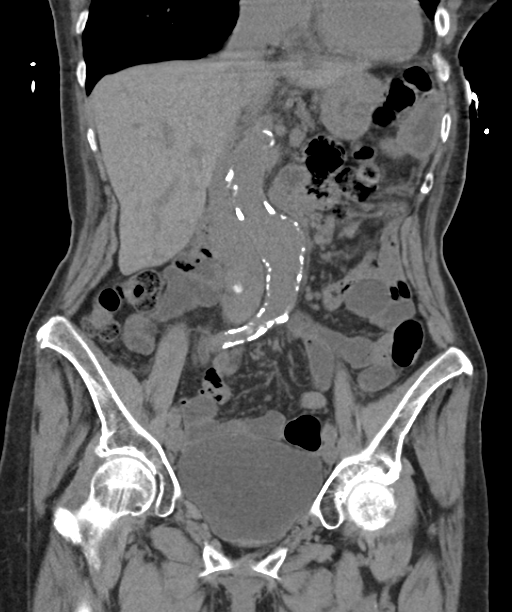
[im 28/63  soft-tissue]
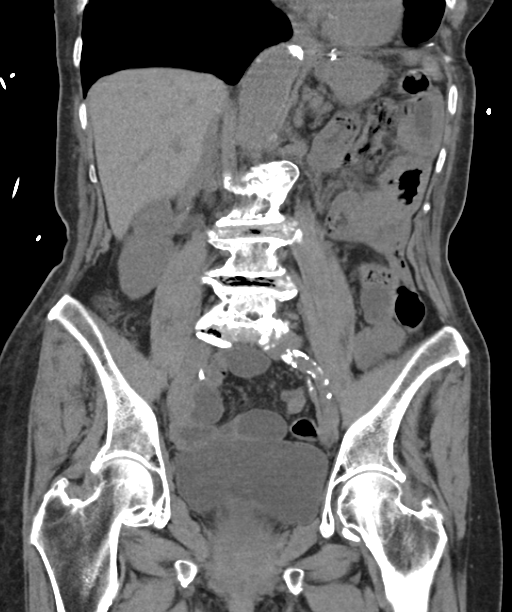
[im 35/63  soft-tissue]
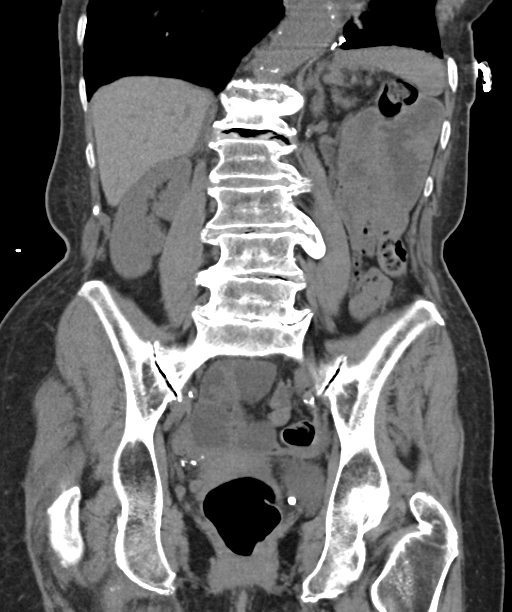

[17 of 46 positions shown; findings below may reference images not displayed]

FINDINGS: Scarring noted at the left base. Right lung bases clear. No
effusions. Heart is normal size.

Liver, gallbladder, pancreas, spleen, adrenals have an unremarkable
unenhanced appearance. No renal or ureteral stones. No
hydronephrosis.

3 cm infrarenal abdominal aortic aneurysm, increased from 2.6 cm
thin in 1261. Diffuse atherosclerotic calcifications throughout the
aorta and iliac vessels.

Uterus, adnexae a and urinary bladder unremarkable.

Stomach, large and small bowel grossly unremarkable. No free fluid,
free air or adenopathy.

No acute bony abnormality. Diffuse degenerative disc disease
throughout the lumbar spine.
IMPRESSION: No renal or ureteral stones.  No hydronephrosis.

Heavily calcified aorta and iliac vessels. Mild aneurysmal
dilatation of the infrarenal aorta, 3.0 cm.

Left basilar scarring.

No acute findings in the abdomen or pelvis.

## 2018-08-19 DIAGNOSIS — R3129 Other microscopic hematuria: Secondary | ICD-10-CM | POA: Diagnosis not present

## 2018-08-19 DIAGNOSIS — R809 Proteinuria, unspecified: Secondary | ICD-10-CM | POA: Diagnosis not present

## 2018-08-19 DIAGNOSIS — Z1389 Encounter for screening for other disorder: Secondary | ICD-10-CM | POA: Diagnosis not present

## 2018-08-19 DIAGNOSIS — I10 Essential (primary) hypertension: Secondary | ICD-10-CM | POA: Diagnosis not present

## 2019-05-14 IMAGING — CR DG CHEST 1V PORT
1 series · 1 of 1 positions shown · non-contrast
Comparison: Prior chest x-ray 01/11/2014

CLINICAL DATA: [AGE] female with chest pain

EXAM:
PORTABLE CHEST 1 VIEW

[portable]
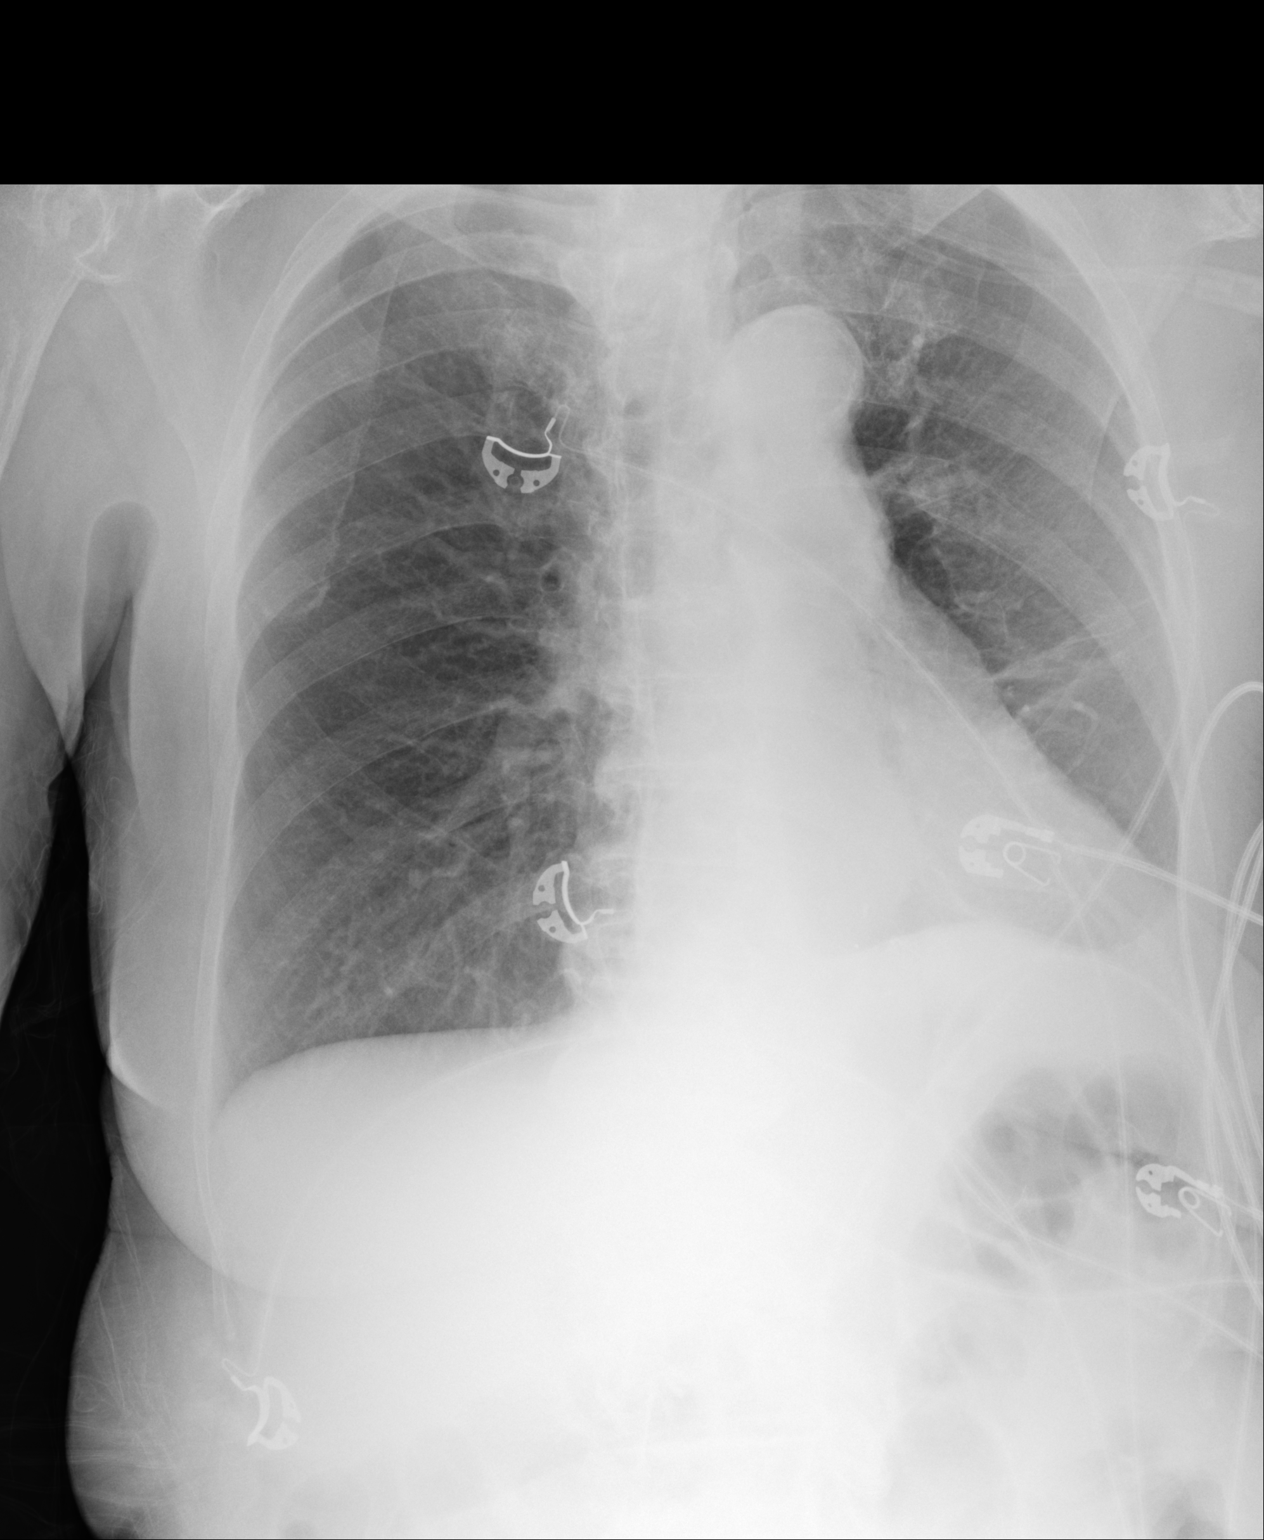

[1 of 1 positions shown; findings below may reference images not displayed]

FINDINGS: Stable cardiac and mediastinal contours. Atherosclerotic
calcifications again noted throughout the transverse aorta.
Inspiratory volumes are low. Slightly increased bibasilar
atelectasis. No pleural effusion, pneumothorax or focal airspace
consolidation. Stable background bronchitic changes and emphysema.
No acute osseous abnormality.
IMPRESSION: 1. Slightly low inspiratory volumes and bibasilar atelectasis.
Otherwise, no acute cardiopulmonary process.
2. Aortic Atherosclerosis ([id number]-170.0)
3. Chronic bronchitic changes and emphysema.

## 2020-06-28 DIAGNOSIS — I10 Essential (primary) hypertension: Secondary | ICD-10-CM | POA: Diagnosis not present

## 2020-08-09 DIAGNOSIS — I10 Essential (primary) hypertension: Secondary | ICD-10-CM | POA: Diagnosis not present

## 2020-08-09 DIAGNOSIS — D649 Anemia, unspecified: Secondary | ICD-10-CM | POA: Diagnosis not present

## 2020-08-09 DIAGNOSIS — K5900 Constipation, unspecified: Secondary | ICD-10-CM | POA: Diagnosis not present
